# Patient Record
Sex: Male | Born: 1965 | Hispanic: Yes | Marital: Married | Smoking: Former smoker
Health system: Southern US, Community
[De-identification: ages and names within clinical notes are randomized; demographics above are authoritative.]

## PROBLEM LIST (undated history)

## (undated) DIAGNOSIS — T7840XA Allergy, unspecified, initial encounter: Secondary | ICD-10-CM

## (undated) DIAGNOSIS — I10 Essential (primary) hypertension: Secondary | ICD-10-CM

## (undated) HISTORY — DX: Essential (primary) hypertension: I10

## (undated) HISTORY — DX: Allergy, unspecified, initial encounter: T78.40XA

---

## 1997-09-29 ENCOUNTER — Other Ambulatory Visit: Admission: RE | Admit: 1997-09-29 | Discharge: 1997-09-29 | Payer: Self-pay | Admitting: Gynecology

## 1997-10-12 ENCOUNTER — Other Ambulatory Visit: Admission: RE | Admit: 1997-10-12 | Discharge: 1997-10-12 | Payer: Self-pay | Admitting: Gynecology

## 1997-10-26 ENCOUNTER — Other Ambulatory Visit: Admission: RE | Admit: 1997-10-26 | Discharge: 1997-10-26 | Payer: Self-pay | Admitting: Gynecology

## 1997-12-03 ENCOUNTER — Ambulatory Visit (HOSPITAL_COMMUNITY): Admission: RE | Admit: 1997-12-03 | Discharge: 1997-12-03 | Payer: Self-pay | Admitting: Gynecology

## 1998-10-29 ENCOUNTER — Inpatient Hospital Stay (HOSPITAL_COMMUNITY): Admission: AD | Admit: 1998-10-29 | Discharge: 1998-10-29 | Payer: Self-pay | Admitting: Gynecology

## 1998-12-15 ENCOUNTER — Inpatient Hospital Stay (HOSPITAL_COMMUNITY): Admission: AD | Admit: 1998-12-15 | Discharge: 1998-12-15 | Payer: Self-pay | Admitting: Obstetrics and Gynecology

## 1998-12-15 ENCOUNTER — Other Ambulatory Visit: Admission: RE | Admit: 1998-12-15 | Discharge: 1998-12-15 | Payer: Self-pay | Admitting: Gynecology

## 1999-01-12 ENCOUNTER — Inpatient Hospital Stay (HOSPITAL_COMMUNITY): Admission: AD | Admit: 1999-01-12 | Discharge: 1999-01-12 | Payer: Self-pay | Admitting: Obstetrics and Gynecology

## 1999-02-27 ENCOUNTER — Inpatient Hospital Stay (HOSPITAL_COMMUNITY): Admission: AD | Admit: 1999-02-27 | Discharge: 1999-02-27 | Payer: Self-pay | Admitting: Obstetrics

## 1999-05-27 ENCOUNTER — Encounter (HOSPITAL_COMMUNITY): Admission: RE | Admit: 1999-05-27 | Discharge: 1999-06-20 | Payer: Self-pay | Admitting: Obstetrics

## 1999-06-03 ENCOUNTER — Inpatient Hospital Stay (HOSPITAL_COMMUNITY): Admission: AD | Admit: 1999-06-03 | Discharge: 1999-06-03 | Payer: Self-pay | Admitting: Obstetrics & Gynecology

## 1999-06-05 ENCOUNTER — Inpatient Hospital Stay (HOSPITAL_COMMUNITY): Admission: AD | Admit: 1999-06-05 | Discharge: 1999-06-05 | Payer: Self-pay | Admitting: *Deleted

## 1999-06-17 ENCOUNTER — Inpatient Hospital Stay (HOSPITAL_COMMUNITY): Admission: AD | Admit: 1999-06-17 | Discharge: 1999-06-20 | Payer: Self-pay | Admitting: Obstetrics

## 1999-06-23 ENCOUNTER — Encounter: Admission: RE | Admit: 1999-06-23 | Discharge: 1999-07-08 | Payer: Self-pay | Admitting: Obstetrics

## 2000-08-29 ENCOUNTER — Emergency Department (HOSPITAL_COMMUNITY): Admission: EM | Admit: 2000-08-29 | Discharge: 2000-08-29 | Payer: Self-pay | Admitting: Internal Medicine

## 2000-11-02 ENCOUNTER — Emergency Department (HOSPITAL_COMMUNITY): Admission: EM | Admit: 2000-11-02 | Discharge: 2000-11-02 | Payer: Self-pay | Admitting: Emergency Medicine

## 2000-11-11 ENCOUNTER — Emergency Department (HOSPITAL_COMMUNITY): Admission: EM | Admit: 2000-11-11 | Discharge: 2000-11-11 | Payer: Self-pay | Admitting: Emergency Medicine

## 2004-07-03 ENCOUNTER — Emergency Department (HOSPITAL_COMMUNITY): Admission: EM | Admit: 2004-07-03 | Discharge: 2004-07-04 | Payer: Self-pay | Admitting: Emergency Medicine

## 2005-08-22 ENCOUNTER — Emergency Department (HOSPITAL_COMMUNITY): Admission: EM | Admit: 2005-08-22 | Discharge: 2005-08-23 | Payer: Self-pay | Admitting: Emergency Medicine

## 2006-04-25 ENCOUNTER — Emergency Department (HOSPITAL_COMMUNITY): Admission: EM | Admit: 2006-04-25 | Discharge: 2006-04-26 | Payer: Self-pay | Admitting: Emergency Medicine

## 2006-08-22 ENCOUNTER — Emergency Department (HOSPITAL_COMMUNITY): Admission: EM | Admit: 2006-08-22 | Discharge: 2006-08-22 | Payer: Self-pay | Admitting: Emergency Medicine

## 2006-10-02 ENCOUNTER — Emergency Department (HOSPITAL_COMMUNITY): Admission: EM | Admit: 2006-10-02 | Discharge: 2006-10-02 | Payer: Self-pay | Admitting: *Deleted

## 2007-04-11 ENCOUNTER — Emergency Department (HOSPITAL_COMMUNITY): Admission: EM | Admit: 2007-04-11 | Discharge: 2007-04-11 | Payer: Self-pay | Admitting: Emergency Medicine

## 2007-06-20 ENCOUNTER — Emergency Department (HOSPITAL_COMMUNITY): Admission: EM | Admit: 2007-06-20 | Discharge: 2007-06-20 | Payer: Self-pay | Admitting: Emergency Medicine

## 2015-11-22 ENCOUNTER — Ambulatory Visit: Payer: Self-pay

## 2015-11-22 ENCOUNTER — Ambulatory Visit (INDEPENDENT_AMBULATORY_CARE_PROVIDER_SITE_OTHER): Payer: Self-pay | Admitting: Physician Assistant

## 2015-11-22 VITALS — BP 120/86 | HR 73 | Temp 98.6°F | Resp 16 | Ht 66.75 in | Wt 212.0 lb

## 2015-11-22 DIAGNOSIS — T465X1A Poisoning by other antihypertensive drugs, accidental (unintentional), initial encounter: Secondary | ICD-10-CM

## 2015-11-22 DIAGNOSIS — R42 Dizziness and giddiness: Secondary | ICD-10-CM

## 2015-11-22 DIAGNOSIS — I10 Essential (primary) hypertension: Secondary | ICD-10-CM

## 2015-11-22 LAB — COMPLETE METABOLIC PANEL WITH GFR
ALT: 25 U/L (ref 9–46)
AST: 19 U/L (ref 10–40)
Albumin: 4.3 g/dL (ref 3.6–5.1)
Alkaline Phosphatase: 67 U/L (ref 40–115)
BILIRUBIN TOTAL: 0.4 mg/dL (ref 0.2–1.2)
BUN: 14 mg/dL (ref 7–25)
CHLORIDE: 101 mmol/L (ref 98–110)
CO2: 24 mmol/L (ref 20–31)
CREATININE: 0.71 mg/dL (ref 0.60–1.35)
Calcium: 10 mg/dL (ref 8.6–10.3)
GFR, Est African American: >89 mL/min (ref >=60)
GFR, Est Non African American: >89 mL/min (ref >=60)
Glucose, Bld: 76 mg/dL (ref 65–99)
Potassium: 4.1 mmol/L (ref 3.5–5.3)
Sodium: 138 mmol/L (ref 135–146)
TOTAL PROTEIN: 6.6 g/dL (ref 6.1–8.1)

## 2015-11-22 LAB — POCT CBC
Granulocyte percent: 70.2 %G (ref 37–80)
HCT, POC: 44.1 % (ref 43.5–53.7)
HEMOGLOBIN: 16 g/dL (ref 14.1–18.1)
LYMPH, POC: 1.7 (ref 0.6–3.4)
MCH, POC: 31.1 pg (ref 27–31.2)
MCHC: 36.2 g/dL — AB (ref 31.8–35.4)
MCV: 85.7 fL (ref 80–97)
MID (cbc): 0.8 (ref 0–0.9)
MPV: 7.5 fL (ref 0–99.8)
PLATELET COUNT, POC: 310 10*3/uL (ref 142–424)
POC Granulocyte: 5.8 (ref 2–6.9)
POC LYMPH PERCENT: 20.5 %L (ref 10–50)
POC MID %: 9.3 %M (ref 0–12)
RBC: 5.14 M/uL (ref 4.69–6.13)
RDW, POC: 13.9 %
WBC: 8.2 10*3/uL (ref 4.6–10.2)

## 2015-11-22 MED ORDER — LISINOPRIL-HYDROCHLOROTHIAZIDE 10-12.5 MG PO TABS
1.0000 | ORAL_TABLET | Freq: Every day | ORAL | 0 refills | Status: DC
Start: 2015-11-22 — End: 2016-02-28

## 2015-11-22 NOTE — Progress Notes (Signed)
   Subjective:    Patient ID: Jacob ClossSergio Reys, male    DOB: Dec 18, 1965, 50 y.o.   MRN: [id number]  HPI    Review of Systems  Constitutional: Positive for unexpected weight change.  HENT: Negative.   Eyes: Positive for visual disturbance.  Respiratory: Negative.   Cardiovascular: Negative.   Gastrointestinal: Positive for abdominal pain.  Endocrine: Positive for heat intolerance.  Genitourinary: Negative.   Musculoskeletal: Negative.   Skin: Negative.   Allergic/Immunologic: Positive for environmental allergies.  Neurological: Positive for dizziness.  Hematological: Negative.   Psychiatric/Behavioral: Negative.        Objective:   Physical Exam        Assessment & Plan:

## 2015-11-22 NOTE — Patient Instructions (Addendum)
I will follow up with you regarding your lab results.  The symptoms are most likely due to the overuse of the anti-hypertensives.  We will discuss monitoring and medication refills at that time. Make sure you take your blood pressure same time every day to keep track.  If you are exhibiting any dizziness, shortness of breath, nausea, sweating--check your blood pressure with a blood pressure monitoring device.  Do not take medication.      IF you received an x-ray today, you will receive an invoice from [facility]. Please contact [facility] at [phone] with questions or concerns regarding your invoice.   IF you received labwork today, you will receive an invoice from [facility]. Please contact [facility] at [phone] with questions or concerns regarding your invoice.   Our billing staff will not be able to assist you with questions regarding bills from these companies.  You will be contacted with the lab results as soon as they are available. The fastest way to get your results is to activate your My Chart account. Instructions are located on the last page of this paperwork. If you have not heard from us regarding the results in 2 weeks, please contact this office.

## 2015-11-22 NOTE — Progress Notes (Addendum)
Patient ID: Jacob Kelley, male   DOB: [birth year], 50 y.o.   MRN: [id number] [facility] [address], [place] [address] [phone]  By signing my name below I, Shelah Lewandowsky, attest that this documentation has been prepared under the direction and in the presence of Trena Platt PA. Electonically Signed. Shelah Lewandowsky, Scribe 11/22/2015 at 3:29 PM   Date:  11/22/2015   Name:  Jacob Kelley   DOB:  1965-10-18   MRN:  [id number]  PCP:  No primary care provider on file.    History of Present Illness:  Jacob Kelley is a 50 y.o. male patient who presents to [facility] c/o dizziness that occurred yesterday and improved today. Pt was drinking a heavily sweetened sweet tea while at work yesterday, then went to a friends house that was very hot because his friend did not have air conditioning. Pt did not drink any water at his friends house. Pt states he then started feeling dizzy every time he stood up. Pt thought that he forgot to take his Lisinopril-hydrochlorothiazide in the morning so he took a second dose. Pt has HTN and is supposed to take Lisinopril hydrochlorothiazide QD in the morning. Pt states that his dizziness worsened after he took his second dose accidentally. Pt also started having diaphoresis, nausea, and hand tremors. Pt denies any CP, palpitations, SOB, blurry vision, or diarrhea.  Pt states he feels back to normal currently.   Pt reports he usually drink 1-2 beers 3-4 times a week.   Pt denies tobacco or any illegal drug use.    Pt reports having frequent burping within the past several months. Pt states he eats a lot of spicy food. Pt has history of GERD and takes TUMs and Alka-Seltzer which typically resolves his symptoms.  Pt states that he ate breakfast this morning.  There are no active problems to display for this patient.   Past Medical History:  Diagnosis Date   Allergy    Hypertension     No past surgical history on file.  Social  History  Substance Use Topics   Smoking status: Former Smoker   Smokeless tobacco: Former Neurosurgeon   Alcohol use 4.2 oz/week    7 Glasses of wine per week    Family History  Problem Relation Age of Onset   Stroke Mother    Heart disease Father    Hypertension Brother     Allergies  Allergen Reactions   Ibuprofen     Swelling in face, eyes    Medication list has been reviewed and updated.  No current outpatient prescriptions on file prior to visit.   No current facility-administered medications on file prior to visit.     ROS ROS unremarkable unless otherwise specified.  Physical Examination: BP 120/86 (BP Location: Left Arm, Patient Position: Sitting, Cuff Size: Normal)    Pulse 73    Temp 98.6 F (37 C) (Oral)    Resp 16    Ht 5' 6.75" (1.695 m)    Wt 212 lb (96.2 kg)    SpO2 96%    BMI 33.45 kg/m  Ideal Body Weight: ([id number])@  Physical Exam  Constitutional: He is oriented to person, place, and time. He appears well-developed and well-nourished. No distress.  HENT:  Head: Normocephalic and atraumatic.  Eyes: Conjunctivae and EOM are normal. Pupils are equal, round, and reactive to light.  Cardiovascular: Normal rate and regular rhythm.  Exam reveals no gallop and no friction rub.  No murmur heard. Pulses:      Carotid pulses are 2+ on the right side, and 2+ on the left side.      Radial pulses are 2+ on the right side, and 2+ on the left side.       Dorsalis pedis pulses are 2+ on the right side, and 2+ on the left side.  Pulmonary/Chest: Effort normal. No respiratory distress. He has no decreased breath sounds. He has no wheezes. He has no rhonchi.  Neurological: He is alert and oriented to person, place, and time.  Skin: Skin is warm and dry. He is not diaphoretic.  Psychiatric: He has a normal mood and affect. His behavior is normal.   Results for orders placed or performed in visit on 11/22/15  POCT CBC  Result Value Ref Range   WBC 8.2 4.6  - 10.2 K/uL   Lymph, poc 1.7 0.6 - 3.4   POC LYMPH PERCENT 20.5 10 - 50 %L   MID (cbc) 0.8 0 - 0.9   POC MID % 9.3 0 - 12 %M   POC Granulocyte 5.8 2 - 6.9   Granulocyte percent 70.2 37 - 80 %G   RBC 5.14 4.69 - 6.13 M/uL   Hemoglobin 16.0 14.1 - 18.1 g/dL   HCT, POC 86.544.1 78.443.5 - 53.7 %   MCV 85.7 80 - 97 fL   MCH, POC 31.1 27 - 31.2 pg   MCHC 36.2 (A) 31.8 - 35.4 g/dL   RDW, POC 69.613.9 %   Platelet Count, POC 310 142 - 424 K/uL   MPV 7.5 0 - 99.8 fL   EKG viewed and interpreted by CanadaStephanie English. EKG shows sinus rhythm, T wave inversion lead III, Possible old anterior infarct, No acute changes.   Assessment and Plan: Hughie ClossSergio Reys is a 50 y.o. male who is here today for dizziness.   This appears secondary to over medicating of anti-hypertensive by accident.  Counseled on this. I am refilling his medication at a neighboring pharmacy of his interest.  He would like to establish care at our facility. So labs are obtained. We can refill for 6 months.  Advised to return if dizziness returns.    Dizziness - Plan: COMPLETE METABOLIC PANEL WITH GFR, EKG 12-Lead, POCT CBC, Orthostatic vital signs, Lipid panel  Essential hypertension - Plan: COMPLETE METABOLIC PANEL WITH GFR, EKG 12-Lead, POCT CBC, Orthostatic vital signs, Lipid panel, lisinopril-hydrochlorothiazide (PRINZIDE,ZESTORETIC) 10-12.5 MG tablet  Overdose of antihypertensive agent, accidental or unintentional, initial encounter - Plan: COMPLETE METABOLIC PANEL WITH GFR, EKG 12-Lead, POCT CBC, Orthostatic vital signs  Trena PlattStephanie English, PA-C [facility] 11/22/2015 2:23 PM  I personally performed the services described in this documentation, which was scribed in my presence. The recorded information has been reviewed and is accurate.

## 2015-12-11 ENCOUNTER — Emergency Department (HOSPITAL_COMMUNITY): Payer: Self-pay

## 2015-12-11 ENCOUNTER — Encounter (HOSPITAL_COMMUNITY): Payer: Self-pay | Admitting: Emergency Medicine

## 2015-12-11 ENCOUNTER — Emergency Department (HOSPITAL_COMMUNITY)
Admission: EM | Admit: 2015-12-11 | Discharge: 2015-12-11 | Disposition: A | Discharge status: Home / Self Care | Payer: Self-pay | Attending: Emergency Medicine | Admitting: Emergency Medicine

## 2015-12-11 DIAGNOSIS — I10 Essential (primary) hypertension: Secondary | ICD-10-CM | POA: Insufficient documentation

## 2015-12-11 DIAGNOSIS — Z87891 Personal history of nicotine dependence: Secondary | ICD-10-CM | POA: Insufficient documentation

## 2015-12-11 DIAGNOSIS — Z79899 Other long term (current) drug therapy: Secondary | ICD-10-CM | POA: Insufficient documentation

## 2015-12-11 DIAGNOSIS — R0789 Other chest pain: Secondary | ICD-10-CM | POA: Insufficient documentation

## 2015-12-11 DIAGNOSIS — R1013 Epigastric pain: Secondary | ICD-10-CM | POA: Insufficient documentation

## 2015-12-11 LAB — COMPREHENSIVE METABOLIC PANEL
ALT: 25 U/L (ref 17–63)
AST: 21 U/L (ref 15–41)
Albumin: 4.1 g/dL (ref 3.5–5.0)
Alkaline Phosphatase: 67 U/L (ref 38–126)
Anion gap: 6 (ref 5–15)
BUN: 16 mg/dL (ref 6–20)
CO2: 28 mmol/L (ref 22–32)
Calcium: 9.4 mg/dL (ref 8.9–10.3)
Chloride: 103 mmol/L (ref 101–111)
Creatinine, Ser: 0.86 mg/dL (ref 0.61–1.24)
GFR calc Af Amer: >60 mL/min (ref >60)
GFR calc non Af Amer: >60 mL/min (ref >60)
Glucose, Bld: 107 mg/dL — ABNORMAL HIGH (ref 65–99)
Potassium: 3.8 mmol/L (ref 3.5–5.1)
Sodium: 137 mmol/L (ref 135–145)
Total Bilirubin: 0.6 mg/dL (ref 0.3–1.2)
Total Protein: 7.1 g/dL (ref 6.5–8.1)

## 2015-12-11 LAB — I-STAT TROPONIN, ED
TROPONIN I, POC: 0 ng/mL (ref 0.00–0.08)
Troponin i, poc: 0 ng/mL (ref 0.00–0.08)

## 2015-12-11 LAB — CBC
HCT: 43.5 % (ref 39.0–52.0)
Hemoglobin: 14.9 g/dL (ref 13.0–17.0)
MCH: 29.7 pg (ref 26.0–34.0)
MCHC: 34.3 g/dL (ref 30.0–36.0)
MCV: 86.8 fL (ref 78.0–100.0)
Platelets: 274 10*3/uL (ref 150–400)
RBC: 5.01 MIL/uL (ref 4.22–5.81)
RDW: 13.2 % (ref 11.5–15.5)
WBC: 8.1 10*3/uL (ref 4.0–10.5)

## 2015-12-11 LAB — LIPASE, BLOOD: Lipase: 28 U/L (ref 11–51)

## 2015-12-11 LAB — URINALYSIS, ROUTINE W REFLEX MICROSCOPIC
Bilirubin Urine: NEGATIVE
Glucose, UA: NEGATIVE mg/dL
Hgb urine dipstick: NEGATIVE
Ketones, ur: NEGATIVE mg/dL
Leukocytes, UA: NEGATIVE
Nitrite: NEGATIVE
Protein, ur: NEGATIVE mg/dL
Specific Gravity, Urine: 1.008 (ref 1.005–1.030)
pH: 6 (ref 5.0–8.0)

## 2015-12-11 MED ORDER — ONDANSETRON HCL 4 MG/2ML IJ SOLN
4.0000 mg | Freq: Once | INTRAMUSCULAR | Status: AC
  Administered 2015-12-11: 4 mg via INTRAVENOUS
  Filled 2015-12-11: qty 2

## 2015-12-11 MED ORDER — SODIUM CHLORIDE 0.9 % IV BOLUS (SEPSIS)
1000.0000 mL | Freq: Once | INTRAVENOUS | Status: AC
  Administered 2015-12-11: 1000 mL via INTRAVENOUS

## 2015-12-11 MED ORDER — MORPHINE SULFATE (PF) 4 MG/ML IV SOLN
4.0000 mg | Freq: Once | INTRAVENOUS | Status: AC
  Administered 2015-12-11: 4 mg via INTRAVENOUS
  Filled 2015-12-11: qty 1

## 2015-12-11 MED ORDER — GI COCKTAIL ~~LOC~~
30.0000 mL | Freq: Once | ORAL | Status: AC
Start: 2015-12-11 — End: 2015-12-11
  Administered 2015-12-11: 30 mL via ORAL
  Filled 2015-12-11: qty 30

## 2015-12-11 MED ORDER — PANTOPRAZOLE SODIUM 20 MG PO TBEC: 20.0000 mg | DELAYED_RELEASE_TABLET | Freq: Every day | ORAL | 0 refills | Status: DC

## 2015-12-11 MED ORDER — ONDANSETRON 4 MG PO TBDP: 4.0000 mg | ORAL_TABLET | Freq: Three times a day (TID) | ORAL | 0 refills | Status: DC | PRN

## 2015-12-11 NOTE — ED Notes (Signed)
RN starting IV, drawing labs 

## 2015-12-11 NOTE — ED Triage Notes (Signed)
Pt c/o sudden onset dizziness and chest pressure x 1 hour. Pt c/o 1 episode of nausea. Pt also c/o abdominal pain x 2 days. A&Ox4 and ambulatory. NAD noted, pt speaking in complete sentences.

## 2015-12-11 NOTE — ED Notes (Signed)
Pt O2 was between 98-100% and pulse was 82

## 2015-12-11 NOTE — ED Provider Notes (Signed)
[facility]-EMERGENCY DEPT Provider Note   CSN: [id number] Arrival date & time: 12/11/15  1648     History   Chief Complaint Chief Complaint  Patient presents with  . Dizziness  . Abdominal Pain  . Chest Pain    HPI Jacob Kelley is a 50 y.o. male.  HPI   Patient is a 50 year old male presents emergency department for evaluation of dizziness, lightheadedness, rapid heart rate, with associated cold sweats, which began at 2:30 while he was driving with his family members. He states that he didn't have any chest pain or shortness of breath during this time it did begin at rest and lasted probably 45 minutes. He drinks some sweet tea and his symptoms resolved.  He has been having similar episodes intermittently over the past 2 weeks which seemed to occur after he has had abdominal pain with associated bloating, excessive belching, and nausea. He did not eat yesterday because of the same symptoms and this morning was working was able to eat breakfast without any pain or discomfort.  He does work in a [employer] and was working all morning prior to the onset of his symptoms at 2:30.  He was seen by his regular doctor in the last week and had lab work obtained.  He denies cough, shortness of breath, wheeze, lower extremity edema, palpitations, orthopnea, PND. He has had no vomiting, states he is having normal bowel movements.  He is a former smoker. He denies any history of exertional near-syncope, exertional dyspnea or exertional chest pain. No history of diabetes, no family history of cardiac disease.   Past Medical History:  Diagnosis Date  . Allergy   . Hypertension     There are no active problems to display for this patient.   History reviewed. No pertinent surgical history.     Home Medications    Prior to Admission medications   Medication Sig Start Date End Date Taking? Authorizing Provider  lisinopril-hydrochlorothiazide (PRINZIDE,ZESTORETIC) 10-12.5 MG tablet Take 1 tablet by  mouth daily. 11/22/15  Yes Stephanie D English, PA  ondansetron (ZOFRAN ODT) 4 MG disintegrating tablet Take 1 tablet (4 mg total) by mouth every 8 (eight) hours as needed for nausea or vomiting. 12/11/15   Danelle Berry, PA-C  pantoprazole (PROTONIX) 20 MG tablet Take 1 tablet (20 mg total) by mouth daily. 12/11/15   Danelle Berry, PA-C    Family History Family History  Problem Relation Age of Onset  . Stroke Mother   . Heart disease Father   . Hypertension Brother     Social History Social History  Substance Use Topics  . Smoking status: Former Games developer  . Smokeless tobacco: Former Neurosurgeon  . Alcohol use 4.2 oz/week    7 Glasses of wine per week     Allergies   Ibuprofen   Review of Systems Review of Systems  All other systems reviewed and are negative.    Physical Exam Updated Vital Signs BP 128/95   Pulse 60   Temp 98.1 F (36.7 C) (Oral)   Resp 21   Ht 5' 6.75" (1.695 m)   Wt 96.2 kg   SpO2 100%   BMI 33.45 kg/m   Physical Exam  Constitutional: He is oriented to person, place, and time. He appears well-developed and well-nourished. No distress.  HENT:  Head: Normocephalic and atraumatic.  Nose: Nose normal.  Mouth/Throat: Oropharynx is clear and moist. No oropharyngeal exudate.  Eyes: Conjunctivae and EOM are normal. Pupils are equal, round, and reactive to  light. Right eye exhibits no discharge. Left eye exhibits no discharge. No scleral icterus.  Neck: Normal range of motion. No JVD present.  Cardiovascular: Normal rate, regular rhythm, normal heart sounds and intact distal pulses.  Exam reveals no gallop and no friction rub.   No murmur heard. No LE edema, symmetrical radial pulses 2+, symmetrical dorsal pedis pulse 2+  Pulmonary/Chest: Effort normal and breath sounds normal. No stridor. No respiratory distress. He has no wheezes. He has no rales. He exhibits no tenderness.  Abdominal: Soft. Bowel sounds are normal. He exhibits no distension and no mass. There is  no tenderness. There is no rebound and no guarding.  Musculoskeletal: Normal range of motion.  Lymphadenopathy:    He has no cervical adenopathy.  Neurological: He is alert and oriented to person, place, and time. He exhibits normal muscle tone. Coordination normal.  Skin: Skin is warm and dry. Capillary refill takes less than 2 seconds. No rash noted. He is not diaphoretic. No erythema. No pallor.  Psychiatric: He has a normal mood and affect. His behavior is normal. Judgment and thought content normal.  Nursing note and vitals reviewed.    ED Treatments / Results  Labs (all labs ordered are listed, but only abnormal results are displayed) Labs Reviewed  COMPREHENSIVE METABOLIC PANEL - Abnormal; Notable for the following:       Result Value   Glucose, Bld 107 (*)    All other components within normal limits  CBC  URINALYSIS, ROUTINE W REFLEX MICROSCOPIC (NOT AT [facility])  LIPASE, BLOOD  I-STAT TROPOININ, ED  Rosezena SensorI-STAT TROPOININ, ED    EKG  EKG Interpretation None       Radiology Dg Chest 2 View  Result Date: 12/11/2015 CLINICAL DATA:  Chest pain. EXAM: CHEST  2 VIEW COMPARISON:  None. FINDINGS: The heart size and mediastinal contours are within normal limits. Both lungs are clear. The visualized skeletal structures are unremarkable. IMPRESSION: Normal chest. Electronically Signed   By: Francene BoyersJames  Maxwell M.D.   On: 12/11/2015 17:39    Procedures Procedures (including critical care time)  Medications Ordered in ED Medications  sodium chloride 0.9 % bolus 1,000 mL (0 mLs Intravenous Stopped 12/11/15 2021)  ondansetron (ZOFRAN) injection 4 mg (4 mg Intravenous Given 12/11/15 1934)  morphine 4 MG/ML injection 4 mg (4 mg Intravenous Given 12/11/15 1934)  gi cocktail (Maalox,Lidocaine,Donnatal) (30 mLs Oral Given 12/11/15 1934)     Initial Impression / Assessment and Plan / ED Course  I have reviewed the triage vital signs and the nursing notes.  Pertinent labs & imaging results  that were available during my care of the patient were reviewed by me and considered in my medical decision making (see chart for details).  Clinical Course   Pt with abdominal pain, CP and lightheaded/dizzy episodes which have been intermittent over the past 2 weeks. Last episode began at 2:30 this afternoon while at rest, lasted about 5 minutes and was relieved after drinking sweet tea.  In the ER patient is asymptomatic.  CP work up initiated, GI cocktail given.  Patient is to be discharged with recommendation to follow up with PCP in regards to today's hospital visit. Chest pain is not likely of cardiac or pulmonary etiology d/t presentation, perc negative, VSS, no tracheal deviation, no JVD or new murmur, RRR, breath sounds equal bilaterally, EKG without acute abnormalities, negative troponin, and negative CXR. Pt has been advised start a PPI and return to the ED is CP becomes exertional, associated with  diaphoresis or nausea, radiates to left jaw/arm, worsens or becomes concerning in any way. Pt appears reliable for follow up and is agreeable to discharge.    Final Clinical Impressions(s) / ED Diagnoses   Final diagnoses:  Atypical chest pain  Dyspepsia    New Prescriptions Discharge Medication List as of 12/11/2015  8:33 PM    START taking these medications   Details  ondansetron (ZOFRAN ODT) 4 MG disintegrating tablet Take 1 tablet (4 mg total) by mouth every 8 (eight) hours as needed for nausea or vomiting., Starting Sat 12/11/2015, Print    pantoprazole (PROTONIX) 20 MG tablet Take 1 tablet (20 mg total) by mouth daily., Starting Sat 12/11/2015, Print         Danelle Berry, PA-C 12/11/15 2205    Lorre Nick, MD 12/12/15 [phone]

## 2015-12-11 NOTE — ED Notes (Signed)
ED PA at bedside

## 2015-12-11 NOTE — ED Notes (Signed)
Sprite was given for po challenge---- tolerating well; pt denies nausea.

## 2015-12-14 ENCOUNTER — Ambulatory Visit (INDEPENDENT_AMBULATORY_CARE_PROVIDER_SITE_OTHER): Payer: Self-pay | Admitting: Physician Assistant

## 2015-12-14 DIAGNOSIS — I10 Essential (primary) hypertension: Secondary | ICD-10-CM

## 2015-12-14 DIAGNOSIS — R42 Dizziness and giddiness: Secondary | ICD-10-CM

## 2015-12-14 LAB — LIPID PANEL
CHOL/HDL RATIO: 4.7 ratio (ref <=5.0)
Cholesterol: 214 mg/dL — ABNORMAL HIGH (ref 125–200)
HDL: 46 mg/dL (ref >=40)
LDL Cholesterol: 149 mg/dL — ABNORMAL HIGH (ref <130)
Triglycerides: 95 mg/dL (ref <150)
VLDL: 19 mg/dL (ref <30)

## 2016-01-04 NOTE — Progress Notes (Signed)
Lab only 

## 2016-02-28 ENCOUNTER — Other Ambulatory Visit: Payer: Self-pay

## 2016-02-28 DIAGNOSIS — I10 Essential (primary) hypertension: Secondary | ICD-10-CM

## 2016-02-28 MED ORDER — LISINOPRIL-HYDROCHLOROTHIAZIDE 10-12.5 MG PO TABS: 1.0000 | ORAL_TABLET | Freq: Every day | ORAL | 0 refills | Status: DC

## 2016-02-28 NOTE — Telephone Encounter (Signed)
Pharm called w/pt there asking for RF of lisinopril. I do not see any completed notes from his OV so will follow protocol. Gave OK for another 90 day supply and pharm agreed to tell pt he needs to RTC for f/up before those run out.

## 2016-06-08 ENCOUNTER — Ambulatory Visit (INDEPENDENT_AMBULATORY_CARE_PROVIDER_SITE_OTHER): Payer: Self-pay | Admitting: Family Medicine

## 2016-06-08 ENCOUNTER — Encounter: Payer: Self-pay | Admitting: Family Medicine

## 2016-06-08 VITALS — BP 128/82 | HR 78 | Temp 98.2°F | Resp 18 | Ht 66.75 in | Wt 213.0 lb

## 2016-06-08 DIAGNOSIS — R05 Cough: Secondary | ICD-10-CM

## 2016-06-08 DIAGNOSIS — I10 Essential (primary) hypertension: Secondary | ICD-10-CM

## 2016-06-08 DIAGNOSIS — L209 Atopic dermatitis, unspecified: Secondary | ICD-10-CM

## 2016-06-08 DIAGNOSIS — R059 Cough, unspecified: Secondary | ICD-10-CM

## 2016-06-08 DIAGNOSIS — E785 Hyperlipidemia, unspecified: Secondary | ICD-10-CM

## 2016-06-08 MED ORDER — LOSARTAN POTASSIUM-HCTZ 50-12.5 MG PO TABS: 1.0000 | ORAL_TABLET | Freq: Every day | ORAL | 1 refills | Status: DC

## 2016-06-08 NOTE — Patient Instructions (Addendum)
Your cough may be coming from lisinopril. Change to losartan HCTZ 1 pill once per day. If blood pressures start running over 140/90, or any new side effects of that medication, return to discuss other options.  Return within the next few weeks for fasting labs. We can check your cholesterol again at that time and determine if other medicines are needed.  The rash appears to be due to eczema or atopic dermatitis. Use over-the-counter Eucerin, cortisone cream up to twice per day, and if that does not help, return to discuss other options.   Atopic Dermatitis Atopic dermatitis is a skin disorder that causes inflammation of the skin. This is the most common type of eczema. Eczema is a group of skin conditions that cause the skin to be itchy, red, and swollen. This condition is generally worse during the cooler winter months and often improves during the warm summer months. Symptoms can vary from person to person. Atopic dermatitis usually starts showing signs in infancy and can last through adulthood. This condition cannot be passed from one person to another (non-contagious), but is more common in families. Atopic dermatitis may not always be present. When it is present, it is called a flare-up. What are the causes? The exact cause of this condition is not known. Flare-ups of the condition may be triggered by:  Contact with something you are sensitive or allergic to.  Stress.  Certain foods.  Extremely hot or cold weather.  Harsh chemicals and soaps.  Dry air.  Chlorine. What increases the risk? This condition is more likely to develop in people who have a personal history or family history of eczema, allergies, asthma, or hay fever. What are the signs or symptoms? Symptoms of this condition include:  Dry, scaly skin.  Red, itchy rash.  Itchiness, which can be severe. This may occur before the skin rash. This can make sleeping difficult.  Skin thickening and cracking can occur over  time. How is this diagnosed? This condition is diagnosed based on your symptoms, a medical history, and a physical exam. How is this treated? There is no cure for this condition, but symptoms can usually be controlled. Treatment focuses on:  Controlling the itching and scratching. You may be given medicines, such as antihistamines or steroid creams.  Limiting exposure to things that you are sensitive or allergic to (allergens).  Recognizing situations that cause stress and developing a plan to manage stress. If your atopic dermatitis does not get better with medicines or is all over your body (widespread) , a treatment using a specific type of light (phototherapy) may be used. Follow these instructions at home: Skin care  Keep your skin well-moisturized. This seals in moisture and help prevent dryness.  Use unscented lotions that have petroleum in them.  Avoid lotions that contain alcohol and water. They can dry the skin.  Keep baths or showers short (less than 5 minutes) in warm water. Do not use hot water.  Use mild, unscented cleansers for bathing. Avoid soap and bubble bath.  Apply a moisturizer to your skin right after a bath or shower.   Do not apply anything to your skin without checking with your health care provider. General instructions  Dress in clothes made of cotton or cotton blends. Dress lightly because heat increases itching.  When washing your clothes, rinse your clothes twice so all of the soap is removed.  Avoid any triggers that can cause a flare-up.  Try to manage your stress.  Keep your fingernails  cut short.  Avoid scratching. Scratching makes the rash and itching worse. It may also result in a skin infection (impetigo) due to a break in the skin caused by scratching.  Take or apply over-the-counter and prescription medicines only as told by your health care provider.  Keep all follow-up visits as told by your health care provider. This is  important.  Do not be around people who have cold sores or fever blisters. If you get the infection, it may cause your atopic dermatitis to worsen. Contact a health care provider if:  Your itching interferes with sleep.  Your rash gets worse or is not better within one week of starting treatment.  You have a fever.  You have a rash flare-up after having contact with someone who has cold sores or fever blisters. Get help right away if:  You develop pus or soft yellow scabs in the rash area. Summary  This condition causes a red rash and itchy, dry, scaly skin.  Treatment focuses on controlling the itching and scratching, limiting exposure to things that you are sensitive or allergic to (allergens), and recognizing situations that cause stress and developing a plan to manage stress.  Keep your skin well-moisturized.  Keep baths or showers less than 5 minutes. This information is not intended to replace advice given to you by your health care provider. Make sure you discuss any questions you have with your health care provider. Document Released: 03/31/2000 Document Revised: 09/09/2015 Document Reviewed: 11/04/2012 Elsevier Interactive Patient Education  2017 [employer].     IF you received an x-ray today, you will receive an invoice from [facility]. Please contact [facility] at [phone] with questions or concerns regarding your invoice.   IF you received labwork today, you will receive an invoice from [facility]. Please contact [facility] at [phone] with questions or concerns regarding your invoice.   Our billing staff will not be able to assist you with questions regarding bills from these companies.  You will be contacted with the lab results as soon as they are available. The fastest way to get your results is to activate your My Chart account. Instructions are located on the last page of this paperwork. If you have not heard from [place] regarding the  results in 2 weeks, please contact this office.

## 2016-06-08 NOTE — Progress Notes (Addendum)
By signing my name below, I, Mesha Guinyard, attest that this documentation has been prepared under the direction and in the presence of Merri Ray, MD.  Electronically Signed: Verlee Monte, Medical Scribe. 06/08/16. 11:38 AM.  Subjective:    Patient ID: Jacob Kelley, male    DOB: May 12, 1965, 51 y.o.   MRN: [id number]  HPI Chief Complaint  Patient presents with  . Medication Refill    lisinopril    HPI Comments: Jacob Kelley is a 51 y.o. male who presents to the [facility] complaining for HTN follow-up.  Pt is not fasting.  HTN: He is on lisinopril-HCTZ 10/12.5 mg QD. Last visit in Aug 2017 for labs only. Pt takes lisinopril-HCTZ in the morning but he occasionally misses his medication. Reports occasional dizziness that resolves with drinking water, and he has a slight cough that occurs in the evening every 4-5 days. Pt reports the time he took 2 pills at once to make up for a missed dose the day before and he went to the hospital after feeling badly. He no longer takes more than the recommended amount. Denies chest pain, SOB, light-headedness, or other acute sxs. Lab Results  Component Value Date   CREATININE 0.86 12/11/2015   BP Readings from Last 3 Encounters:  06/08/16 128/82  12/11/15 128/95  11/22/15 120/86   HLD: Recommended to start OTC Omega-3 supplement and recheck levels in approx 6 months. Pt has not been taking the fish-oil supplements. Pt mentions he eats a lot of grilled shrimp and fish - he mainly eats it while at work. 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 Pt stays away from cheese. Lab Results  Component Value Date   CHOL 214 (H) 12/14/2015   HDL 46 12/14/2015   LDLCALC 149 (H) 12/14/2015   TRIG 95 12/14/2015   CHOLHDL 4.7 12/14/2015   Dry Skin: Located in various places on his body such as his ankles, leg, and elbows. It only occurs during the winter and he uses cortisone cream Intermittently for relief of his  sxs.  There are no active problems to display for this patient.  Past Medical History:  Diagnosis Date  . Allergy   . Hypertension    History reviewed. No pertinent surgical history. Allergies  Allergen Reactions  . Ibuprofen Swelling and Other (See Comments)    Reaction:  Facial/eye swelling    Prior to Admission medications   Medication Sig Start Date End Date Taking? Authorizing Provider  lisinopril-hydrochlorothiazide (PRINZIDE,ZESTORETIC) 10-12.5 MG tablet Take 1 tablet by mouth daily. 02/28/16  Yes Stephanie D English, PA  ondansetron (ZOFRAN ODT) 4 MG disintegrating tablet Take 1 tablet (4 mg total) by mouth every 8 (eight) hours as needed for nausea or vomiting. 12/11/15   Delsa Grana, PA-C  pantoprazole (PROTONIX) 20 MG tablet Take 1 tablet (20 mg total) by mouth daily. 12/11/15   Delsa Grana, PA-C   Social History   Social History  . Marital status: Single    Spouse name: N/A  . Number of children: N/A  . Years of education: N/A   Occupational History  . Not on file.   Social History Main Topics  . Smoking status: Former Research scientist (life sciences)  . Smokeless tobacco: Former Systems developer  . Alcohol use 4.2 oz/week    7 Glasses of wine per week  . Drug use: No  . Sexual activity: Not on file   Other Topics Concern  . Not on file   Social History Narrative  .  No narrative on file   Review of Systems  Constitutional: Negative for fatigue and unexpected weight change.  Eyes: Negative for visual disturbance.  Respiratory: Positive for cough. Negative for chest tightness and shortness of breath.   Cardiovascular: Negative for chest pain, palpitations and leg swelling.  Gastrointestinal: Negative for abdominal pain and blood in stool.  Skin: Positive for color change and rash. Negative for pallor.  Neurological: Positive for dizziness. Negative for light-headedness and headaches.   Objective:  Physical Exam  Constitutional: He is oriented to person, place, and time. He appears  well-developed and well-nourished.  HENT:  Head: Normocephalic and atraumatic.  Eyes: EOM are normal. Pupils are equal, round, and reactive to light.  Neck: No JVD present. Carotid bruit is not present.  Cardiovascular: Normal rate, regular rhythm and normal heart sounds.  Exam reveals no gallop and no friction rub.   No murmur heard. Pulmonary/Chest: Effort normal and breath sounds normal. No respiratory distress. He has no wheezes. He has no rales.  Musculoskeletal: He exhibits no edema.  Neurological: He is alert and oriented to person, place, and time.  Skin: Skin is warm and dry. Rash noted. There is erythema.  Slightly thickened hyperpigmented/red skin medial right lower leg as well as some areas on the left anterior leg  Psychiatric: He has a normal mood and affect.  Vitals reviewed.   Vitals:   06/08/16 1044  BP: 128/82  Pulse: 78  Resp: 18  Temp: 98.2 F (36.8 C)  TempSrc: Oral  SpO2: 96%  Weight: 213 lb (96.6 kg)  Height: 5' 6.75" (1.695 m)  Body mass index is 33.61 kg/m. Assessment & Plan:    Jacob Kelley is a 51 y.o. male Essential hypertension - Plan: losartan-hydrochlorothiazide (HYZAAR) 50-12.5 MG tablet, CMP14+EGFR  - Stable, but with dry cough, potentially ACE inhibitor cough. Trial of losartan 50/12.5 milligrams daily, check labs. If cough persists with losartan, unlikely ACE inhibitor cause.  Cough  - As above, possible ACE inhibitor cause. If persistent, return to discuss other causes. Handout given.  Atopic dermatitis, unspecified type  -Over-the-counter Eucerin twice a day when necessary, okay to continue topical cortisone as mild symptoms, but consider triamcinolone or stronger potency steroid if persistent symptoms.  Hyperlipidemia, unspecified hyperlipidemia type - Plan: CMP14+EGFR, Lipid panel  - Check labs, then determine 10 year risk to decide if statin indicated.  Meds ordered this encounter  Medications  . losartan-hydrochlorothiazide  (HYZAAR) 50-12.5 MG tablet    Sig: Take 1 tablet by mouth daily.    Dispense:  90 tablet    Refill:  1   Patient Instructions   Your cough may be coming from lisinopril. Change to losartan HCTZ 1 pill once per day. If blood pressures start running over 140/90, or any new side effects of that medication, return to discuss other options.  Return within the next few weeks for fasting labs. We can check your cholesterol again at that time and determine if other medicines are needed.  The rash appears to be due to eczema or atopic dermatitis. Use over-the-counter Eucerin, cortisone cream up to twice per day, and if that does not help, return to discuss other options.   Atopic Dermatitis Atopic dermatitis is a skin disorder that causes inflammation of the skin. This is the most common type of eczema. Eczema is a group of skin conditions that cause the skin to be itchy, red, and swollen. This condition is generally worse during the cooler winter months and often improves during  the warm summer months. Symptoms can vary from person to person. Atopic dermatitis usually starts showing signs in infancy and can last through adulthood. This condition cannot be passed from one person to another (non-contagious), but is more common in families. Atopic dermatitis may not always be present. When it is present, it is called a flare-up. What are the causes? The exact cause of this condition is not known. Flare-ups of the condition may be triggered by:  Contact with something you are sensitive or allergic to.  Stress.  Certain foods.  Extremely hot or cold weather.  Harsh chemicals and soaps.  Dry air.  Chlorine. What increases the risk? This condition is more likely to develop in people who have a personal history or family history of eczema, allergies, asthma, or hay fever. What are the signs or symptoms? Symptoms of this condition include:  Dry, scaly skin.  Red, itchy rash.  Itchiness,  which can be severe. This may occur before the skin rash. This can make sleeping difficult.  Skin thickening and cracking can occur over time. How is this diagnosed? This condition is diagnosed based on your symptoms, a medical history, and a physical exam. How is this treated? There is no cure for this condition, but symptoms can usually be controlled. Treatment focuses on:  Controlling the itching and scratching. You may be given medicines, such as antihistamines or steroid creams.  Limiting exposure to things that you are sensitive or allergic to (allergens).  Recognizing situations that cause stress and developing a plan to manage stress. If your atopic dermatitis does not get better with medicines or is all over your body (widespread) , a treatment using a specific type of light (phototherapy) may be used. Follow these instructions at home: Skin care  Keep your skin well-moisturized. This seals in moisture and help prevent dryness.  Use unscented lotions that have petroleum in them.  Avoid lotions that contain alcohol and water. They can dry the skin.  Keep baths or showers short (less than 5 minutes) in warm water. Do not use hot water.  Use mild, unscented cleansers for bathing. Avoid soap and bubble bath.  Apply a moisturizer to your skin right after a bath or shower.   Do not apply anything to your skin without checking with your health care provider. General instructions  Dress in clothes made of cotton or cotton blends. Dress lightly because heat increases itching.  When washing your clothes, rinse your clothes twice so all of the soap is removed.  Avoid any triggers that can cause a flare-up.  Try to manage your stress.  Keep your fingernails cut short.  Avoid scratching. Scratching makes the rash and itching worse. It may also result in a skin infection (impetigo) due to a break in the skin caused by scratching.  Take or apply over-the-counter and prescription  medicines only as told by your health care provider.  Keep all follow-up visits as told by your health care provider. This is important.  Do not be around people who have cold sores or fever blisters. If you get the infection, it may cause your atopic dermatitis to worsen. Contact a health care provider if:  Your itching interferes with sleep.  Your rash gets worse or is not better within one week of starting treatment.  You have a fever.  You have a rash flare-up after having contact with someone who has cold sores or fever blisters. Get help right away if:  You develop pus or soft  yellow scabs in the rash area. Summary  This condition causes a red rash and itchy, dry, scaly skin.  Treatment focuses on controlling the itching and scratching, limiting exposure to things that you are sensitive or allergic to (allergens), and recognizing situations that cause stress and developing a plan to manage stress.  Keep your skin well-moisturized.  Keep baths or showers less than 5 minutes. This information is not intended to replace advice given to you by your health care provider. Make sure you discuss any questions you have with your health care provider. Document Released: 03/31/2000 Document Revised: 09/09/2015 Document Reviewed: 11/04/2012 Elsevier Interactive Patient Education  2017 [employer].     IF you received an x-ray today, you will receive an invoice from [facility]. Please contact [facility] at [phone] with questions or concerns regarding your invoice.   IF you received labwork today, you will receive an invoice from [facility]. Please contact [facility] at [phone] with questions or concerns regarding your invoice.   Our billing staff will not be able to assist you with questions regarding bills from these companies.  You will be contacted with the lab results as soon as they are available. The fastest way to get your results is to activate your  My Chart account. Instructions are located on the last page of this paperwork. If you have not heard from [place] regarding the results in 2 weeks, please contact this office.      I personally performed the services described in this documentation, which was scribed in my presence. The recorded information has been reviewed and considered for accuracy and completeness, addended by me as needed, and agree with information above.  Signed,   Merri Ray, MD [facility] [place].  06/09/16 1:09 PM

## 2016-11-27 ENCOUNTER — Other Ambulatory Visit: Payer: Self-pay | Admitting: Family Medicine

## 2016-11-27 DIAGNOSIS — I10 Essential (primary) hypertension: Secondary | ICD-10-CM

## 2017-01-08 ENCOUNTER — Telehealth: Payer: Self-pay | Admitting: Family Medicine

## 2017-01-08 NOTE — Telephone Encounter (Signed)
PATIENT WANTS DR. Neva Seat TO KNOW HE NEEDS A REFILL ON HIS LOSARTAN-HYDROCHLOROTHIAZIDE (HYZAAR) 50-12.5 MG. HE SAID HE TOOK HIS LAST PILL ON THUR. OR FRI. OF LAST WEEK. (I DID EXPLAIN OUR REFILL POLICY BUT I WILL PUT MESSAGE AS HIGH PRIORITY). BEST PHONE [phone] (CELL) PHARMACY CHOICE IS [facility]. MBC

## 2017-01-08 NOTE — Telephone Encounter (Signed)
Informed patient he should have 2 more refills and will need to get in before this prescription runs out

## 2017-04-20 ENCOUNTER — Ambulatory Visit (INDEPENDENT_AMBULATORY_CARE_PROVIDER_SITE_OTHER): Payer: Self-pay | Admitting: Urgent Care

## 2017-04-20 ENCOUNTER — Encounter: Payer: Self-pay | Admitting: Urgent Care

## 2017-04-20 VITALS — BP 169/109 | HR 60 | Temp 97.8°F | Resp 18 | Ht 66.0 in | Wt 226.2 lb

## 2017-04-20 DIAGNOSIS — Z6832 Body mass index (BMI) 32.0-32.9, adult: Secondary | ICD-10-CM

## 2017-04-20 DIAGNOSIS — I10 Essential (primary) hypertension: Secondary | ICD-10-CM

## 2017-04-20 DIAGNOSIS — E782 Mixed hyperlipidemia: Secondary | ICD-10-CM

## 2017-04-20 DIAGNOSIS — R0789 Other chest pain: Secondary | ICD-10-CM

## 2017-04-20 DIAGNOSIS — E669 Obesity, unspecified: Secondary | ICD-10-CM

## 2017-04-20 MED ORDER — LOSARTAN POTASSIUM 100 MG PO TABS: 100.0000 mg | ORAL_TABLET | Freq: Every day | ORAL | 3 refills | Status: DC

## 2017-04-20 MED ORDER — AMLODIPINE BESYLATE 5 MG PO TABS: 5.0000 mg | ORAL_TABLET | Freq: Every day | ORAL | 3 refills | Status: DC

## 2017-04-20 MED ORDER — ATORVASTATIN CALCIUM 10 MG PO TABS: 10.0000 mg | ORAL_TABLET | Freq: Every day | ORAL | 3 refills | Status: DC

## 2017-04-20 NOTE — Progress Notes (Addendum)
   MRN: [id number] DOB: 1965-12-03  Subjective:   Jacob Kelley is a 52 y.o. male presenting for follow up on Hypertension. Currently managed with lisinopril. Patient is not checking blood pressure at home. Reports having intermittent and transient mid-sternal to epigastric chest pain mostly at night lasting a few seconds but wakes him out of his sleep at times. Has also felt some tingling in his left arm. Admits that he is not compliant with his los-HCTZ, has had dizziness at night even on days that he does take his medication. He is worried about the recall with HCTZ and wants a different BP medication. Denies dizziness, chronic headache, shortness of breath, heart racing, palpitations, nausea, vomiting, abdominal pain, hematuria, lower leg swelling. Denies smoking cigarettes. Has a glass of wine daily. Of note, patient is very anxious due to having lost a very close friend recently, he had an MI and is very worried that this will happen with him as well.   Jacob Kelley has a current medication list which includes the following prescription(s): losartan-hydrochlorothiazide. Also is allergic to ibuprofen.  Jacob Kelley  has a past medical history of Allergy and Hypertension. Denies past surgical history.   Objective:   Vitals: BP (!) 169/109   Pulse 60   Temp 97.8 F (36.6 C) (Oral)   Resp 18   Ht 5\' 6"  (1.676 m)   Wt 226 lb 3.2 oz (102.6 kg)   SpO2 94%   BMI 36.51 kg/m   The 10-year ASCVD risk score Jacob George(Goff DC Jr., et al., 2013) is: 8.4%   Values used to calculate the score:     Age: [age] years     Sex: Male     Is Non-Hispanic African American: No     Diabetic: No     Tobacco smoker: No     Systolic Blood Pressure: 169 mmHg     Is BP treated: Yes     HDL Cholesterol: 42 mg/dL     Total Cholesterol: 210 mg/dL  Physical Exam  Constitutional: He is oriented to person, place, and time. He appears well-developed and well-nourished.  HENT:  Mouth/Throat: Oropharynx is clear and moist.  Eyes:  Right eye exhibits no discharge. Left eye exhibits no discharge. No scleral icterus.  Cardiovascular: Normal rate, regular rhythm and intact distal pulses. Exam reveals no gallop and no friction rub.  No murmur heard. Pulmonary/Chest: No respiratory distress. He has no wheezes. He has no rales.  Abdominal: Soft. Bowel sounds are normal. He exhibits no distension and no mass. There is no tenderness. There is no guarding.  Musculoskeletal: He exhibits no edema.  Neurological: He is alert and oriented to person, place, and time.  Skin: Skin is warm and dry.   ECG interpretation - sinus bradycardia at 58 without any acute changes compared to ecg from 12/11/2015.  Assessment and Plan :   Essential hypertension - Plan: Comprehensive metabolic panel, Microalbumin / creatinine urine ratio  Class 1 obesity without serious comorbidity with body mass index (BMI) of 32.0 to 32.9 in adult, unspecified obesity type - Plan: Lipid panel, Hemoglobin A1c  Atypical chest pain - Plan: EKG 12-Lead  Mixed hyperlipidemia   Stop los-HCTZ. Start losartan, amlodipine. Recommended lifestyle modifications. Start atorvastatin. Monitor chest pain, labs pending. Return-to-clinic precautions discussed, patient verbalized understanding.   Wallis BambergMario Lumina Gitto, PA-C [facility] [phone] 04/20/2017  8:19 AM

## 2017-04-20 NOTE — Patient Instructions (Addendum)
For this first week, please take only losartan for your blood pressure. After this week, you can start both losartan and amlodipine. Atorvastatin will be for cholesterol and helping against heart disease. You can start this today.   Hypertension Hypertension is another name for high blood pressure. High blood pressure forces your heart to work harder to pump blood. This can cause problems over time. There are two numbers in a blood pressure reading. There is a top number (systolic) over a bottom number (diastolic). It is best to have a blood pressure below 120/80. Healthy choices can help lower your blood pressure. You may need medicine to help lower your blood pressure if:  Your blood pressure cannot be lowered with healthy choices.  Your blood pressure is higher than 130/80.  Follow these instructions at home: Eating and drinking  If directed, follow the DASH eating plan. This diet includes: ? Filling half of your plate at each meal with fruits and vegetables. ? Filling one quarter of your plate at each meal with whole grains. Whole grains include whole wheat pasta, brown rice, and whole grain bread. ? Eating or drinking low-fat dairy products, such as skim milk or low-fat yogurt. ? Filling one quarter of your plate at each meal with low-fat (lean) proteins. Low-fat proteins include fish, skinless chicken, eggs, beans, and tofu. ? Avoiding fatty meat, cured and processed meat, or chicken with skin. ? Avoiding premade or processed food.  Eat less than 1,500 mg of salt (sodium) a day.  Limit alcohol use to no more than 1 drink a day for nonpregnant women and 2 drinks a day for men. One drink equals 12 oz of beer, 5 oz of wine, or 1 oz of hard liquor. Lifestyle  Work with your doctor to stay at a healthy weight or to lose weight. Ask your doctor what the best weight is for you.  Get at least 30 minutes of exercise that causes your heart to beat faster (aerobic exercise) most days of the  week. This may include walking, swimming, or biking.  Get at least 30 minutes of exercise that strengthens your muscles (resistance exercise) at least 3 days a week. This may include lifting weights or pilates.  Do not use any products that contain nicotine or tobacco. This includes cigarettes and e-cigarettes. If you need help quitting, ask your doctor.  Check your blood pressure at home as told by your doctor.  Keep all follow-up visits as told by your doctor. This is important. Medicines  Take over-the-counter and prescription medicines only as told by your doctor. Follow directions carefully.  Do not skip doses of blood pressure medicine. The medicine does not work as well if you skip doses. Skipping doses also puts you at risk for problems.  Ask your doctor about side effects or reactions to medicines that you should watch for. Contact a doctor if:  You think you are having a reaction to the medicine you are taking.  You have headaches that keep coming back (recurring).  You feel dizzy.  You have swelling in your ankles.  You have trouble with your vision. Get help right away if:  You get a very bad headache.  You start to feel confused.  You feel weak or numb.  You feel faint.  You get very bad pain in your: ? Chest. ? Belly (abdomen).  You throw up (vomit) more than once.  You have trouble breathing. Summary  Hypertension is another name for high blood pressure.  Making healthy choices can help lower blood pressure. If your blood pressure cannot be controlled with healthy choices, you may need to take medicine. This information is not intended to replace advice given to you by your health care provider. Make sure you discuss any questions you have with your health care provider. Document Released: 09/20/2007 Document Revised: 03/01/2016 Document Reviewed: 03/01/2016 Elsevier Interactive Patient Education  2018 [employer].    Losartan tablets What is  this medicine? LOSARTAN (loe SAR tan) is used to treat high blood pressure and to reduce the risk of stroke in certain patients. This drug also slows the progression of kidney disease in patients with diabetes. This medicine may be used for other purposes; ask your health care provider or pharmacist if you have questions. COMMON BRAND NAME(S): Cozaar What should I tell my health care provider before I take this medicine? They need to know if you have any of these conditions: -heart failure -kidney or liver disease -an unusual or allergic reaction to losartan, other medicines, foods, dyes, or preservatives -pregnant or trying to get pregnant -breast-feeding How should I use this medicine? Take this medicine by mouth with a glass of water. Follow the directions on the prescription label. This medicine can be taken with or without food. Take your doses at regular intervals. Do not take your medicine more often than directed. Talk to your pediatrician regarding the use of this medicine in children. Special care may be needed. Overdosage: If you think you have taken too much of this medicine contact a poison control center or emergency room at once. NOTE: This medicine is only for you. Do not share this medicine with others. What if I miss a dose? If you miss a dose, take it as soon as you can. If it is almost time for your next dose, take only that dose. Do not take double or extra doses. What may interact with this medicine? -blood pressure medicines -diuretics, especially triamterene, spironolactone, or amiloride -fluconazole -NSAIDs, medicines for pain and inflammation, like ibuprofen or naproxen -potassium salts or potassium supplements -rifampin This list may not describe all possible interactions. Give your health care provider a list of all the medicines, herbs, non-prescription drugs, or dietary supplements you use. Also tell them if you smoke, drink alcohol, or use illegal drugs. Some  items may interact with your medicine. What should I watch for while using this medicine? Visit your doctor or health care professional for regular checks on your progress. Check your blood pressure as directed. Ask your doctor or health care professional what your blood pressure should be and when you should contact him or her. Call your doctor or health care professional if you notice an irregular or fast heart beat. Women should inform their doctor if they wish to become pregnant or think they might be pregnant. There is a potential for serious side effects to an unborn child, particularly in the second or third trimester. Talk to your health care professional or pharmacist for more information. You may get drowsy or dizzy. Do not drive, use machinery, or do anything that needs mental alertness until you know how this drug affects you. Do not stand or sit up quickly, especially if you are an older patient. This reduces the risk of dizzy or fainting spells. Alcohol can make you more drowsy and dizzy. Avoid alcoholic drinks. Avoid salt substitutes unless you are told otherwise by your doctor or health care professional. Do not treat yourself for coughs, colds, or pain  while you are taking this medicine without asking your doctor or health care professional for advice. Some ingredients may increase your blood pressure. What side effects may I notice from receiving this medicine? Side effects that you should report to your doctor or health care professional as soon as possible: -confusion, dizziness, light headedness or fainting spells -decreased amount of urine passed -difficulty breathing or swallowing, hoarseness, or tightening of the throat -fast or irregular heart beat, palpitations, or chest pain -skin rash, itching -swelling of your face, lips, tongue, hands, or feet Side effects that usually do not require medical attention (report to your doctor or health care professional if they continue or  are bothersome): -cough -decreased sexual function or desire -headache -nasal congestion or stuffiness -nausea or stomach pain -sore or cramping muscles This list may not describe all possible side effects. Call your doctor for medical advice about side effects. You may report side effects to FDA at 1-800-FDA-1088. Where should I keep my medicine? Keep out of the reach of children. Store at room temperature between 15 and 30 degrees C (59 and 86 degrees F). Protect from light. Keep container tightly closed. Throw away any unused medicine after the expiration date. NOTE: This sheet is a summary. It may not cover all possible information. If you have questions about this medicine, talk to your doctor, pharmacist, or health care provider.  2018 Elsevier/Gold Standard (2007-06-14 16:42:18)    Amlodipine tablets What is this medicine? AMLODIPINE (am LOE di peen) is a calcium-channel blocker. It affects the amount of calcium found in your heart and muscle cells. This relaxes your blood vessels, which can reduce the amount of work the heart has to do. This medicine is used to lower high blood pressure. It is also used to prevent chest pain. This medicine may be used for other purposes; ask your health care provider or pharmacist if you have questions. COMMON BRAND NAME(S): Norvasc What should I tell my health care provider before I take this medicine? They need to know if you have any of these conditions: -heart problems like heart failure or aortic stenosis -liver disease -an unusual or allergic reaction to amlodipine, other medicines, foods, dyes, or preservatives -pregnant or trying to get pregnant -breast-feeding How should I use this medicine? Take this medicine by mouth with a glass of water. Follow the directions on the prescription label. Take your medicine at regular intervals. Do not take more medicine than directed. Talk to your pediatrician regarding the use of this medicine in  children. Special care may be needed. This medicine has been used in children as young as 6. Persons over 46 years old may have a stronger reaction to this medicine and need smaller doses. Overdosage: If you think you have taken too much of this medicine contact a poison control center or emergency room at once. NOTE: This medicine is only for you. Do not share this medicine with others. What if I miss a dose? If you miss a dose, take it as soon as you can. If it is almost time for your next dose, take only that dose. Do not take double or extra doses. What may interact with this medicine? -herbal or dietary supplements -local or general anesthetics -medicines for high blood pressure -medicines for prostate problems -rifampin This list may not describe all possible interactions. Give your health care provider a list of all the medicines, herbs, non-prescription drugs, or dietary supplements you use. Also tell them if you smoke, drink alcohol, or  use illegal drugs. Some items may interact with your medicine. What should I watch for while using this medicine? Visit your doctor or health care professional for regular check ups. Check your blood pressure and pulse rate regularly. Ask your health care professional what your blood pressure and pulse rate should be, and when you should contact him or her. This medicine may make you feel confused, dizzy or lightheaded. Do not drive, use machinery, or do anything that needs mental alertness until you know how this medicine affects you. To reduce the risk of dizzy or fainting spells, do not sit or stand up quickly, especially if you are an older patient. Avoid alcoholic drinks; they can make you more dizzy. Do not suddenly stop taking amlodipine. Ask your doctor or health care professional how you can gradually reduce the dose. What side effects may I notice from receiving this medicine? Side effects that you should report to your doctor or health care  professional as soon as possible: -allergic reactions like skin rash, itching or hives, swelling of the face, lips, or tongue -breathing problems -changes in vision or hearing -chest pain -fast, irregular heartbeat -swelling of legs or ankles Side effects that usually do not require medical attention (report to your doctor or health care professional if they continue or are bothersome): -dry mouth -facial flushing -nausea, vomiting -stomach gas, pain -tired, weak -trouble sleeping This list may not describe all possible side effects. Call your doctor for medical advice about side effects. You may report side effects to FDA at 1-800-FDA-1088. Where should I keep my medicine? Keep out of the reach of children. Store at room temperature between 59 and 86 degrees F (15 and 30 degrees C). Protect from light. Keep container tightly closed. Throw away any unused medicine after the expiration date. NOTE: This sheet is a summary. It may not cover all possible information. If you have questions about this medicine, talk to your doctor, pharmacist, or health care provider.  2018 Elsevier/Gold Standard (2012-03-01 11:40:58)    Atorvastatin tablets What is this medicine? ATORVASTATIN (a TORE va sta tin) is known as a HMG-CoA reductase inhibitor or 'statin'. It lowers the level of cholesterol and triglycerides in the blood. This drug may also reduce the risk of heart attack, stroke, or other health problems in patients with risk factors for heart disease. Diet and lifestyle changes are often used with this drug. This medicine may be used for other purposes; ask your health care provider or pharmacist if you have questions. COMMON BRAND NAME(S): Lipitor What should I tell my health care provider before I take this medicine? They need to know if you have any of these conditions: -frequently drink alcoholic beverages -history of stroke, TIA -kidney disease -liver disease -muscle aches or  weakness -other medical condition -an unusual or allergic reaction to atorvastatin, other medicines, foods, dyes, or preservatives -pregnant or trying to get pregnant -breast-feeding How should I use this medicine? Take this medicine by mouth with a glass of water. Follow the directions on the prescription label. You can take this medicine with or without food. Take your doses at regular intervals. Do not take your medicine more often than directed. Talk to your pediatrician regarding the use of this medicine in children. While this drug may be prescribed for children as young as 32 years old for selected conditions, precautions do apply. Overdosage: If you think you have taken too much of this medicine contact a poison control center or emergency room at once.  NOTE: This medicine is only for you. Do not share this medicine with others. What if I miss a dose? If you miss a dose, take it as soon as you can. If it is almost time for your next dose, take only that dose. Do not take double or extra doses. What may interact with this medicine? Do not take this medicine with any of the following medications: -red yeast rice -telaprevir -telithromycin -voriconazole This medicine may also interact with the following medications: -alcohol -antiviral medicines for HIV or AIDS -boceprevir -certain antibiotics like clarithromycin, erythromycin, troleandomycin -certain medicines for cholesterol like fenofibrate or gemfibrozil -cimetidine -clarithromycin -colchicine -cyclosporine -digoxin -male hormones, like estrogens or progestins and birth control pills -grapefruit juice -medicines for fungal infections like fluconazole, itraconazole, ketoconazole -niacin -rifampin -spironolactone This list may not describe all possible interactions. Give your health care provider a list of all the medicines, herbs, non-prescription drugs, or dietary supplements you use. Also tell them if you smoke, drink  alcohol, or use illegal drugs. Some items may interact with your medicine. What should I watch for while using this medicine? Visit your doctor or health care professional for regular check-ups. You may need regular tests to make sure your liver is working properly. Tell your doctor or health care professional right away if you get any unexplained muscle pain, tenderness, or weakness, especially if you also have a fever and tiredness. Your doctor or health care professional may tell you to stop taking this medicine if you develop muscle problems. If your muscle problems do not go away after stopping this medicine, contact your health care professional. This drug is only part of a total heart-health program. Your doctor or a dietician can suggest a low-cholesterol and low-fat diet to help. Avoid alcohol and smoking, and keep a proper exercise schedule. Do not use this drug if you are pregnant or breast-feeding. Serious side effects to an unborn child or to an infant are possible. Talk to your doctor or pharmacist for more information. This medicine may affect blood sugar levels. If you have diabetes, check with your doctor or health care professional before you change your diet or the dose of your diabetic medicine. If you are going to have surgery tell your health care professional that you are taking this drug. What side effects may I notice from receiving this medicine? Side effects that you should report to your doctor or health care professional as soon as possible: -allergic reactions like skin rash, itching or hives, swelling of the face, lips, or tongue -dark urine -fever -joint pain -muscle cramps, pain -redness, blistering, peeling or loosening of the skin, including inside the mouth -trouble passing urine or change in the amount of urine -unusually weak or tired -yellowing of eyes or skin Side effects that usually do not require medical attention (report to your doctor or health care  professional if they continue or are bothersome): -constipation -heartburn -stomach gas, pain, upset This list may not describe all possible side effects. Call your doctor for medical advice about side effects. You may report side effects to FDA at 1-800-FDA-1088. Where should I keep my medicine? Keep out of the reach of children. Store at room temperature between 20 to 25 degrees C (68 to 77 degrees F). Throw away any unused medicine after the expiration date. NOTE: This sheet is a summary. It may not cover all possible information. If you have questions about this medicine, talk to your doctor, pharmacist, or health care provider.  2018 Elsevier/Gold  Standard (2011-02-21 09:18:24)     IF you received an x-ray today, you will receive an invoice from [facility]. Please contact [facility] at [phone] with questions or concerns regarding your invoice.   IF you received labwork today, you will receive an invoice from [facility]. Please contact [facility] at [phone] with questions or concerns regarding your invoice.   Our billing staff will not be able to assist you with questions regarding bills from these companies.  You will be contacted with the lab results as soon as they are available. The fastest way to get your results is to activate your My Chart account. Instructions are located on the last page of this paperwork. If you have not heard from [place] regarding the results in 2 weeks, please contact this office.

## 2017-04-21 LAB — COMPREHENSIVE METABOLIC PANEL
ALBUMIN: 4.2 g/dL (ref 3.5–5.5)
ALT: 26 IU/L (ref 0–44)
AST: 21 IU/L (ref 0–40)
Albumin/Globulin Ratio: 1.8 (ref 1.2–2.2)
Alkaline Phosphatase: 77 IU/L (ref 39–117)
BILIRUBIN TOTAL: 0.4 mg/dL (ref 0.0–1.2)
BUN / CREAT RATIO: 17 (ref 9–20)
BUN: 14 mg/dL (ref 6–24)
CALCIUM: 9.4 mg/dL (ref 8.7–10.2)
CHLORIDE: 106 mmol/L (ref 96–106)
CO2: 20 mmol/L (ref 20–29)
CREATININE: 0.82 mg/dL (ref 0.76–1.27)
GFR, EST AFRICAN AMERICAN: 119 mL/min/{1.73_m2} (ref >59)
GFR, EST NON AFRICAN AMERICAN: 103 mL/min/{1.73_m2} (ref >59)
GLUCOSE: 94 mg/dL (ref 65–99)
Globulin, Total: 2.4 g/dL (ref 1.5–4.5)
Potassium: 4.3 mmol/L (ref 3.5–5.2)
Sodium: 143 mmol/L (ref 134–144)
TOTAL PROTEIN: 6.6 g/dL (ref 6.0–8.5)

## 2017-04-21 LAB — LIPID PANEL
CHOL/HDL RATIO: 5 ratio (ref 0.0–5.0)
Cholesterol, Total: 210 mg/dL — ABNORMAL HIGH (ref 100–199)
HDL: 42 mg/dL (ref >39)
LDL CALC: 152 mg/dL — AB (ref 0–99)
Triglycerides: 80 mg/dL (ref 0–149)
VLDL CHOLESTEROL CAL: 16 mg/dL (ref 5–40)

## 2017-04-21 LAB — MICROALBUMIN / CREATININE URINE RATIO
Creatinine, Urine: 187.3 mg/dL
Microalb/Creat Ratio: 2.7 mg/g creat (ref 0.0–30.0)
Microalbumin, Urine: 5 ug/mL

## 2017-04-21 LAB — HEMOGLOBIN A1C
Est. average glucose Bld gHb Est-mCnc: 114 mg/dL
HEMOGLOBIN A1C: 5.6 % (ref 4.8–5.6)

## 2017-05-01 ENCOUNTER — Encounter: Payer: Self-pay | Admitting: Urgent Care

## 2017-05-24 ENCOUNTER — Ambulatory Visit: Payer: Self-pay | Admitting: Urgent Care

## 2017-05-29 ENCOUNTER — Ambulatory Visit (INDEPENDENT_AMBULATORY_CARE_PROVIDER_SITE_OTHER): Payer: Self-pay | Admitting: Urgent Care

## 2017-05-29 ENCOUNTER — Encounter: Payer: Self-pay | Admitting: Urgent Care

## 2017-05-29 VITALS — BP 132/82 | HR 68 | Temp 97.6°F | Resp 17 | Ht 67.5 in | Wt 219.0 lb

## 2017-05-29 DIAGNOSIS — E782 Mixed hyperlipidemia: Secondary | ICD-10-CM

## 2017-05-29 DIAGNOSIS — R1013 Epigastric pain: Secondary | ICD-10-CM

## 2017-05-29 DIAGNOSIS — I10 Essential (primary) hypertension: Secondary | ICD-10-CM

## 2017-05-29 MED ORDER — OMEPRAZOLE 20 MG PO CPDR: 20.0000 mg | DELAYED_RELEASE_CAPSULE | Freq: Every day | ORAL | 3 refills | Status: DC

## 2017-05-29 MED ORDER — HYDROCHLOROTHIAZIDE 12.5 MG PO TABS: 12.5000 mg | ORAL_TABLET | Freq: Every day | ORAL | 3 refills | Status: DC

## 2017-05-29 MED ORDER — RANITIDINE HCL 150 MG PO TABS: 150.0000 mg | ORAL_TABLET | Freq: Two times a day (BID) | ORAL | 1 refills | Status: DC

## 2017-05-29 NOTE — Progress Notes (Signed)
    MRN: [id number] DOB: 09-Feb-1966  Subjective:   Jacob Kelley is a 52 y.o. male presenting for follow up on Hypertension. Currently managed with amlodipine, losartan. Has started avoiding salt in diet, is exercising and trying to eat much healthier. Reports that he has lower leg swelling, would like to come off of amlodipine and restart HCTZ. Also continues to have epigastric pain, eats acidic foods. Also has bloating. Denies dizziness, chronic headache, chest pain, shortness of breath, heart racing, palpitations, nausea, vomiting, hematuria, lower leg swelling. Denies smoking cigarettes.   Jacob Kelley has a current medication list which includes the following prescription(s): amlodipine and losartan. Also is allergic to ibuprofen.  Jacob Kelley  has a past medical history of Allergy and Hypertension. Denies past surgical history.  Objective:   Vitals: BP 132/82   Pulse 68   Temp 97.6 F (36.4 C) (Oral)   Resp 17   Ht 5' 7.5" (1.715 m)   Wt 219 lb (99.3 kg)   SpO2 98%   BMI 33.79 kg/m   Wt Readings from Last 3 Encounters:  05/29/17 219 lb (99.3 kg)  04/20/17 226 lb 3.2 oz (102.6 kg)  06/08/16 213 lb (96.6 kg)    BP Readings from Last 3 Encounters:  05/29/17 132/82  04/20/17 (!) 169/109  06/08/16 128/82   The 10-year ASCVD risk score [name](Goff DC Jr., et al., 2013) is: 5.5%   Values used to calculate the score:     Age: [age] years     Sex: Male     Is Non-Hispanic African American: No     Diabetic: No     Tobacco smoker: No     Systolic Blood Pressure: 132 mmHg     Is BP treated: Yes     HDL Cholesterol: 42 mg/dL     Total Cholesterol: 210 mg/dL   Physical Exam  Constitutional: He is oriented to person, place, and time. He appears well-developed and well-nourished.  HENT:  Mouth/Throat: Oropharynx is clear and moist.  Eyes: No scleral icterus.  Cardiovascular: Normal rate, regular rhythm and intact distal pulses. Exam reveals no gallop and no friction rub.  No murmur  heard. Pulmonary/Chest: No respiratory distress. He has no wheezes. He has no rales.  Abdominal: Soft. Bowel sounds are normal. He exhibits no distension and no mass. There is tenderness (epigastric). There is no guarding.  Musculoskeletal: He exhibits no edema.  Neurological: He is alert and oriented to person, place, and time.  Skin: Skin is warm and dry.  Psychiatric: He has a normal mood and affect.    Assessment and Plan :   Abdominal pain, epigastric - Plan: H. pylori breath test  Essential hypertension  Mixed hyperlipidemia  BP well controlled. Restart HCTZ. Stop amlodipine. Stop statin. Start Prilosec with Zantac. Check for h. Pylori pending. Return-to-clinic precautions discussed, patient verbalized understanding. Otherwise, follow up in 6 months.  Wallis BambergMario Arlester Keehan, PA-C [facility] [phone] 05/29/2017  9:22 AM

## 2017-05-29 NOTE — Patient Instructions (Addendum)
Puedes usar 150mg  Zantac (ranitidine) dos veces diaramente.    Opciones de alimentos para pacientes adultos con enfermedad de reflujo gastroesofgico Food Choices for Gastroesophageal Reflux Disease, Adult Si tiene enfermedad de reflujo gastroesofgico (ERGE), los alimentos que consume y los hbitos de alimentacin son muy importantes. Elegir los alimentos adecuados puede ayudar a Paramedicaliviar las molestias ocasionadas por la Hartford FinancialERGE. Considere la posibilidad de trabajar con un especialista en dieta y nutricin (nutricionista) que lo ayude a Software engineerhacer elecciones saludables. Qu pautas generales debo seguir? Plan de alimentacin  Elija alimentos saludables con bajo contenido de grasa, como frutas, verduras, cereales integrales, productos lcteos descremados, carne magra de vaca, de pescado y de ave.  Haga comidas pequeas con frecuencia en lugar de tres comidas abundantes al da. Coma lentamente, en un ambiente distendido. Evite agacharse o recostarse hasta despus de 2 o 3horas de haber comido.  Limite los alimentos con alto contenido graso como las carnes grasas o los alimentos fritos.  Limite el consumo de Crestlineaceites, Snellingmanteca y Indiamargarina a menos de 8 cucharaditas al Futures traderda.  Evite lo siguiente: ? Consumir alimentos que le ocasionen sntomas. Pueden ser distintos para cada persona. Lleve un registro de los alimentos para identificar aquellos que le ocasionen sntomas. ? Consumir alcohol. ? Beber grandes cantidades de lquido con las comidas. ? Comer 2 o 3 horas antes de acostarse.  Cocine los alimentos utilizando mtodos que no sean la fritura. Esto puede Writerincluir hornear, Technical sales engineeremparrillar y hervir. Estilo de vida   Mantenga un peso saludable. Pregunte a su mdico cul es el peso saludable para usted. Si debe perder peso, hable con su mdico para hacerlo de manera segura.  Realice actividad fsica durante, al menos, 30 minutos 5 das por semana o ms, o segn lo indicado por su mdico.  Evite usar ropa  ajustada alrededor de la cintura y Naval architectel pecho.  No consuma ningn producto que contenga nicotina o tabaco, como cigarrillos y Administrator, Civil Servicecigarrillos electrnicos. Si necesita ayuda para dejar de fumar, consulte al mdico.  Duerma con la cabecera de la cama elevada. Use una cua debajo del colchn o bloques debajo del armazn de la cama para Pharmacologistmantener la cabecera de la cama elevada. Qu alimentos no se recomiendan? Esta podra no ser Raytheonuna lista completa. Hable con el nutricionista sobre las mejores opciones alimenticias para usted. Carbohidratos Pasteles o panes sin levadura con grasa agregada. Tostadas francesas. Verduras Verduras fritas en abundante aceite. Papas fritas. Cualquier verdura que est preparada con grasa agregada. Cualquier verdura que le ocasione sntomas. Para algunas personas, estas pueden incluir tomates y productos con tomate, Hillsboroajes, cebollas y [place] Hillsajo, y rbanos picantes. Frutas Cualquier fruta que est preparada con grasa agregada. Cualquier fruta que le ocasione sntomas. Para algunas personas, estas pueden incluir, las frutas ctricas como naranja, pomelo, pia y limn. Carnes y otros alimentos ricos en protenas Carnes de alto contenido graso como carne grasa de vaca o cerdo, salchichas, costillas, Lakewood Villagejamn, salchicha, salame y tocino. Carnes o protenas fritas, lo que incluye pescado frito y pollo frito. Nueces y Danversmantequillas de frutos secos. Lcteos Leche entera y Bullardleche con chocolate. PPG IndustriesCrema cida. Crema. Helados. Queso crema. Batidos con WPS Resourcesleche. Bebidas Caf y t negro, con o sin cafena. Bebidas carbonatadas. Refrescos. Bebidas energizantes. Jugo de fruta hecho con frutas cidas (como naranja o pomelo). Jugo de tomate. Bebidas alcohlicas. Grasas y Barnes & Nobleaceites Mantequilla. Margarina. Lardo. Mantequilla clarificada. Dulces y postres Chocolate y cacao. Rosquillas. Condimentos y otros alimentos TyroneshirePimienta. Menta y mentol. Cualquier condimento, hierbas o aderezos que le ocasionen sntomas.  Para  algunas personas, esto puede incluir curry, salsa picante o aderezos para ensalada a base de vinagre. Resumen  Si tiene enfermedad de reflujo gastroesofgico (ERGE), las elecciones de alimentos y [place] de vida son muy importantes para ayudar a Paramedic las molestias de la [place].  Haga comidas pequeas con frecuencia en lugar de tres comidas abundantes al da. Coma lentamente, en un ambiente distendido. Evite agacharse o recostarse hasta despus de 2 o 3horas de haber comido.  Limite los alimentos con alto contenido graso como la carne grasa o los alimentos fritos. Esta informacin no tiene Theme park manager el consejo del mdico. Asegrese de hacerle al mdico cualquier pregunta que tenga. Document Released: 01/11/2005 Document Revised: 07/24/2016 Document Reviewed: 02/05/2013 Elsevier Interactive Patient Education  2018 [employer].     IF you received an x-ray today, you will receive an invoice from [facility]. Please contact [facility] at [phone] with questions or concerns regarding your invoice.   IF you received labwork today, you will receive an invoice from [facility]. Please contact [facility] at [phone] with questions or concerns regarding your invoice.   Our billing staff will not be able to assist you with questions regarding bills from these companies.  You will be contacted with the lab results as soon as they are available. The fastest way to get your results is to activate your My Chart account. Instructions are located on the last page of this paperwork. If you have not heard from [place] regarding the results in 2 weeks, please contact this office.

## 2017-06-01 ENCOUNTER — Other Ambulatory Visit: Payer: Self-pay | Admitting: Urgent Care

## 2017-06-01 LAB — H. PYLORI BREATH TEST: H pylori Breath Test: POSITIVE — AB

## 2017-06-01 MED ORDER — AMOXICILLIN 500 MG PO CAPS: 1000.0000 mg | ORAL_CAPSULE | Freq: Two times a day (BID) | ORAL | 0 refills | Status: DC

## 2017-06-01 MED ORDER — OMEPRAZOLE 20 MG PO CPDR: 20.0000 mg | DELAYED_RELEASE_CAPSULE | Freq: Two times a day (BID) | ORAL | 1 refills | Status: DC

## 2017-06-01 MED ORDER — CLARITHROMYCIN 500 MG PO TABS: 500.0000 mg | ORAL_TABLET | Freq: Two times a day (BID) | ORAL | 0 refills | Status: DC

## 2017-07-26 ENCOUNTER — Encounter: Payer: Self-pay | Admitting: Gastroenterology

## 2017-07-26 ENCOUNTER — Ambulatory Visit (INDEPENDENT_AMBULATORY_CARE_PROVIDER_SITE_OTHER): Payer: Self-pay | Admitting: Urgent Care

## 2017-07-26 ENCOUNTER — Encounter: Payer: Self-pay | Admitting: Urgent Care

## 2017-07-26 ENCOUNTER — Other Ambulatory Visit: Payer: Self-pay

## 2017-07-26 VITALS — BP 141/85 | HR 75 | Temp 98.4°F | Resp 18 | Ht 67.5 in | Wt 217.6 lb

## 2017-07-26 DIAGNOSIS — I10 Essential (primary) hypertension: Secondary | ICD-10-CM

## 2017-07-26 DIAGNOSIS — R8290 Unspecified abnormal findings in urine: Secondary | ICD-10-CM

## 2017-07-26 DIAGNOSIS — Z23 Encounter for immunization: Secondary | ICD-10-CM

## 2017-07-26 DIAGNOSIS — Z1211 Encounter for screening for malignant neoplasm of colon: Secondary | ICD-10-CM

## 2017-07-26 DIAGNOSIS — R1013 Epigastric pain: Secondary | ICD-10-CM

## 2017-07-26 DIAGNOSIS — Z8619 Personal history of other infectious and parasitic diseases: Secondary | ICD-10-CM

## 2017-07-26 DIAGNOSIS — Z9109 Other allergy status, other than to drugs and biological substances: Secondary | ICD-10-CM

## 2017-07-26 MED ORDER — FLUCONAZOLE 150 MG PO TABS: 150.0000 mg | ORAL_TABLET | ORAL | 0 refills | Status: DC

## 2017-07-26 MED ORDER — PSEUDOEPHEDRINE HCL 60 MG PO TABS: 60.0000 mg | ORAL_TABLET | Freq: Three times a day (TID) | ORAL | 0 refills | Status: DC | PRN

## 2017-07-26 MED ORDER — FEXOFENADINE HCL 180 MG PO TABS: 180.0000 mg | ORAL_TABLET | Freq: Every day | ORAL | 1 refills | Status: DC

## 2017-07-26 MED ORDER — OMEPRAZOLE 20 MG PO CPDR: 20.0000 mg | DELAYED_RELEASE_CAPSULE | Freq: Two times a day (BID) | ORAL | 1 refills | Status: DC

## 2017-07-26 NOTE — Patient Instructions (Addendum)
We will restart omeprazole for your reflux symptoms.  I want you to take the full dose of Allegra at 180 mg every day.  Pseudoephedrine is only supposed to be used as needed and with your history of high blood pressure we should just use 60 mg once every 8 hours.  Make sure you are hydrating very well with at least 2 L of water daily.  For your urinary symptom let us try to use Diflucan for a couple weeks.  We will call you to try make an appointment for your colonoscopy.    IF you received an x-ray today, you will receive an invoice from [facility] Radiology. Please contact [facility] Radiology at [phone] with questions or concerns regarding your invoice.   IF you received labwork today, you will receive an invoice from [place]. Please contact [facility] at [phone] with questions or concerns regarding your invoice.   Our billing staff will not be able to assist you with questions regarding bills from these companies.  You will be contacted with the lab results as soon as they are available. The fastest way to get your results is to activate your My Chart account. Instructions are located on the last page of this paperwork. If you have not heard from [place] regarding the results in 2 weeks, please contact this office.

## 2017-07-26 NOTE — Progress Notes (Signed)
    MRN: [id number] DOB: 1965-05-15  Subjective:   Jacob PittsSergio R Kelley is a 52 y.o. male presenting for allergies, penile issue, H pylori infection follow-up.  For his allergies patient has been using Allegra-D, 60 mg or Allegra and 120 mg for pseudoephedrine.  He has gotten sinus pain, sinus congestion, cough and sore throat but denies fever, chest pain, wheezing.  He does very well with the ephedrine but is concerned about how high it takes his blood pressure.  He hydrates very well.  He also states that he is worried about his colon from having taken eradication therapy for H. pylori.  States that he had some pretty bad constipation while on the medications.  He has never had a colonoscopy as he is wondering if this is necessary at this point.  He also reports that he has had malodorous urine since taking the antibiotics for his H. pylori infection.  Jacob Kelley has a current medication list which includes the following prescription(s): hydrochlorothiazide, losartan, and fexofenadine. Also is allergic to ibuprofen.  Jacob Kelley  has a past medical history of Allergy and Hypertension. Also  has no past surgical history on file.  Objective:   Vitals: BP (!) 141/85   Pulse 75   Temp 98.4 F (36.9 C) (Oral)   Resp 18   Ht 5' 7.5" (1.715 m)   Wt 217 lb 9.6 oz (98.7 kg)   SpO2 99%   BMI 33.58 kg/m   Physical Exam  Constitutional: He is oriented to person, place, and time. He appears well-developed and well-nourished.  HENT:  Right Ear: Tympanic membrane normal.  Left Ear: Tympanic membrane normal.  Nose: No sinus tenderness.  Mouth/Throat: Oropharynx is clear and moist.  Eyes: No scleral icterus.  Cardiovascular: Normal rate, regular rhythm and intact distal pulses. Exam reveals no gallop and no friction rub.  No murmur heard. Pulmonary/Chest: No respiratory distress. He has no wheezes. He has no rales.  Abdominal: Soft. Bowel sounds are normal. He exhibits no distension. There is no tenderness.  There is no rebound and no guarding.  Neurological: He is alert and oriented to person, place, and time.  Skin: Skin is warm and dry.  Psychiatric: He has a normal mood and affect.   Assessment and Plan :   Abdominal pain, epigastric  History of Helicobacter infection  Environmental allergies  Essential hypertension  Need for Tdap vaccination  Screen for colon cancer - Plan: Ambulatory referral to Gastroenterology  Malodorous urine - Plan: Yeast Only, Culture, GC/Chlamydia Probe Amp([facility]), Trichomonas vaginalis, RNA  Will restart patient on omeprazole for GERD-like symptoms.  Patient is to take Allegra at 180 mg and use Sudafed at 60 mg as needed.  His urinary symptom has unclear etiology but patient would like to try antifungal with Diflucan.  Labs pending.  Follow-up as needed.  Patient will be scheduled for screening colonoscopy.  Tdap updated today.  Jacob BambergMario Halimah Bewick, PA-C [facility] [phone] 07/26/2017  9:55 AM

## 2017-07-27 LAB — TRICHOMONAS VAGINALIS, PROBE AMP: Trich vag by NAA: NEGATIVE

## 2017-07-27 LAB — GC/CHLAMYDIA PROBE AMP
Chlamydia trachomatis, NAA: NEGATIVE
Neisseria gonorrhoeae by PCR: NEGATIVE

## 2017-08-03 LAB — YEAST ONLY, CULTURE

## 2017-10-04 ENCOUNTER — Encounter: Payer: Self-pay | Admitting: Gastroenterology

## 2018-04-22 ENCOUNTER — Other Ambulatory Visit: Payer: Self-pay | Admitting: Urgent Care

## 2018-05-30 ENCOUNTER — Other Ambulatory Visit: Payer: Self-pay | Admitting: Family Medicine

## 2018-05-30 NOTE — Telephone Encounter (Signed)
Copied from CRM [id number]. Topic: Quick Communication - Rx Refill/Question >> May 30, 2018  2:42 PM [place], [place] wrote: Medication: losartan (COZAAR) 100 MG tablet / hydrochlorothiazide (HYDRODIURIL) 12.5 MG tablet   Has the patient contacted their pharmacy? Yes.   (Agent: If no, request that the patient contact the pharmacy for the refill.) (Agent: If yes, when and what did the pharmacy advise?)  Preferred Pharmacy (with phone number or street name):[facility] [place] [address] [phone] (Phone) [phone] (Fax)  Agent: Please be advised that RX refills may take up to 3 business days. We ask that you follow-up with your pharmacy.

## 2018-05-30 NOTE — Telephone Encounter (Signed)
Call placed to patient via interpreter Onalee Hua [phone]. Appointment scheduled for medication check. Pt is requesting refills to bridge until OV.

## 2018-05-30 NOTE — Telephone Encounter (Signed)
Requested medication (s) are due for refill today: yes  Requested medication (s) are on the active medication list: yes  Last refill:  Losartan 04/23/2018, Hydrodiuril 05/29/17  Future visit scheduled: yes  Notes to clinic:  Pt is out of medication. Labs out of date. Pt requesting enough medication to bridge him to his appointment. Please advise patient.    Requested Prescriptions  Pending Prescriptions Disp Refills   losartan (COZAAR) 100 MG tablet 30 tablet 0    Sig: Take 1 tablet (100 mg total) by mouth daily.     Cardiovascular:  Angiotensin Receptor Blockers Failed - 05/30/2018  3:17 PM      Failed - Cr in normal range and within 180 days    Creat  Date Value Ref Range Status  11/22/2015 0.71 0.60 - 1.35 mg/dL Final   Creatinine, Ser  Date Value Ref Range Status  04/20/2017 0.82 0.76 - 1.27 mg/dL Final         Failed - K in normal range and within 180 days    Potassium  Date Value Ref Range Status  04/20/2017 4.3 3.5 - 5.2 mmol/L Final         Failed - Last BP in normal range    BP Readings from Last 1 Encounters:  07/26/17 (!) 141/85         Failed - Valid encounter within last 6 months    Recent Outpatient Visits          10 months ago Abdominal pain, epigastric   Primary Care at [facility], [place], PA-C   1 year ago Abdominal pain, epigastric   Primary Care at [facility], [place], [place]   1 year ago Essential hypertension   Primary Care at [facility], [place], [place]   1 year ago Essential hypertension   Primary Care at [place], [place] Partridge, [place]   2 years ago Essential hypertension   Primary Care at [place], [place], [place]      Future Appointments            In 3 weeks Shade Flood, MD Primary Care at [place], [facility]           Passed - Patient is not pregnant     hydrochlorothiazide (HYDRODIURIL) 12.5 MG tablet 90 tablet 3    Sig: Take 1 tablet (12.5 mg total) by mouth daily.     Cardiovascular: Diuretics - Thiazide Failed - 05/30/2018   3:17 PM      Failed - Ca in normal range and within 360 days    Calcium  Date Value Ref Range Status  04/20/2017 9.4 8.7 - 10.2 mg/dL Final         Failed - Cr in normal range and within 360 days    Creat  Date Value Ref Range Status  11/22/2015 0.71 0.60 - 1.35 mg/dL Final   Creatinine, Ser  Date Value Ref Range Status  04/20/2017 0.82 0.76 - 1.27 mg/dL Final         Failed - K in normal range and within 360 days    Potassium  Date Value Ref Range Status  04/20/2017 4.3 3.5 - 5.2 mmol/L Final         Failed - Na in normal range and within 360 days    Sodium  Date Value Ref Range Status  04/20/2017 143 134 - 144 mmol/L Final         Failed - Last BP in normal range    BP  Readings from Last 1 Encounters:  07/26/17 (!) 141/85         Failed - Valid encounter within last 6 months    Recent Outpatient Visits          10 months ago Abdominal pain, epigastric   Primary Care at [facility], [place], [place]   1 year ago Abdominal pain, epigastric   Primary Care at [facility], [place], [place]   1 year ago Essential hypertension   Primary Care at [facility], [place], [place]   1 year ago Essential hypertension   Primary Care at [place], Asencion Partridge, [place]   2 years ago Essential hypertension   Primary Care at [place], [place]      Future Appointments            In 3 weeks Neva Seat Asencion Partridge, MD Primary Care at [place], [facility]

## 2018-06-03 MED ORDER — LOSARTAN POTASSIUM 100 MG PO TABS: 100.0000 mg | ORAL_TABLET | Freq: Every day | ORAL | 0 refills | Status: DC

## 2018-06-03 MED ORDER — HYDROCHLOROTHIAZIDE 12.5 MG PO TABS: 12.5000 mg | ORAL_TABLET | Freq: Every day | ORAL | 0 refills | Status: DC

## 2018-06-24 ENCOUNTER — Encounter: Payer: Self-pay | Admitting: Family Medicine

## 2018-06-24 ENCOUNTER — Other Ambulatory Visit: Payer: Self-pay

## 2018-06-24 ENCOUNTER — Ambulatory Visit: Payer: Self-pay | Admitting: Family Medicine

## 2018-06-24 VITALS — BP 128/82 | HR 68 | Temp 98.6°F | Resp 16 | Ht 67.0 in | Wt 231.2 lb

## 2018-06-24 DIAGNOSIS — Z13220 Encounter for screening for lipoid disorders: Secondary | ICD-10-CM

## 2018-06-24 DIAGNOSIS — R600 Localized edema: Secondary | ICD-10-CM

## 2018-06-24 DIAGNOSIS — I10 Essential (primary) hypertension: Secondary | ICD-10-CM

## 2018-06-24 DIAGNOSIS — J309 Allergic rhinitis, unspecified: Secondary | ICD-10-CM

## 2018-06-24 MED ORDER — LOSARTAN POTASSIUM 100 MG PO TABS: 100.0000 mg | ORAL_TABLET | Freq: Every day | ORAL | 1 refills | Status: DC

## 2018-06-24 MED ORDER — FLUTICASONE PROPIONATE 50 MCG/ACT NA SUSP
2.0000 | Freq: Every day | NASAL | 6 refills | Status: DC
Start: 2018-06-24 — End: 2018-12-04

## 2018-06-24 MED ORDER — HYDROCHLOROTHIAZIDE 12.5 MG PO TABS: 12.5000 mg | ORAL_TABLET | Freq: Every day | ORAL | 1 refills | Status: DC

## 2018-06-24 MED ORDER — FEXOFENADINE HCL 180 MG PO TABS: 180.0000 mg | ORAL_TABLET | Freq: Every day | ORAL | 1 refills | Status: DC

## 2018-06-24 NOTE — Progress Notes (Signed)
Subjective:    Patient ID: Jacob Kelley, male    DOB: [birth year], 53 y.o.   MRN: [id number]  HPI Jacob Kelley is a 53 y.o. male Presents today for: Chief Complaint  Patient presents with  . Hypertension    was last seen here 06/08/16 for HTN was put on hctz which patient stated is not working for him cause he still have swellin in the ankle area  . seasonal allergies    patient would like a refill on allergy meds. Allegra 180 mg   Hypertension: BP Readings from Last 3 Encounters:  06/24/18 128/82  07/26/17 (!) 141/85  05/29/17 132/82   Lab Results  Component Value Date   CREATININE 0.82 04/20/2017  losartan 100mg  QD,hctz 12.5mg  QD.  Some swelling in ankles after a few hours, improves at night with elevation.  No new side effects.  Has cut back on beer now, but had been drinking some more beer last month. Exercising more past few weeks.   Wt Readings from Last 3 Encounters:  06/24/18 231 lb 3.2 oz (104.9 kg)  07/26/17 217 lb 9.6 oz (98.7 kg)  05/29/17 219 lb (99.3 kg)     Allergic rhinitis: Allegra once per day. Doing well on this regimen, but some allergy symptoms return.    UTD on health maintenance      There are no active problems to display for this patient.  Past Medical History:  Diagnosis Date  . Allergy   . Hypertension    History reviewed. No pertinent surgical history. Allergies  Allergen Reactions  . Ibuprofen Swelling and Other (See Comments)    Reaction:  Facial/eye swelling    Prior to Admission medications   Medication Sig Start Date End Date Taking? Authorizing Provider  fexofenadine (ALLEGRA ALLERGY) 180 MG tablet Take 1 tablet (180 mg total) by mouth daily. 07/26/17   Wallis BambergMani, Mario, PA-C  hydrochlorothiazide (HYDRODIURIL) 12.5 MG tablet Take 1 tablet (12.5 mg total) by mouth daily. 06/03/18   Shade FloodGreene, Ivan Maskell R, MD  losartan (COZAAR) 100 MG tablet Take 1 tablet (100 mg total) by mouth daily. 06/03/18   Shade FloodGreene, Rhian Asebedo R, MD   Social  History   Socioeconomic History  . Marital status: Single    Spouse name: Not on file  . Number of children: Not on file  . Years of education: Not on file  . Highest education level: Not on file  Occupational History  . Not on file  Social Needs  . Financial resource strain: Not on file  . Food insecurity:    Worry: Not on file    Inability: Not on file  . Transportation needs:    Medical: Not on file    Non-medical: Not on file  Tobacco Use  . Smoking status: Former Games developermoker  . Smokeless tobacco: Former Engineer, waterUser  Substance and Sexual Activity  . Alcohol use: Yes    Alcohol/week: 7.0 standard drinks    Types: 7 Glasses of wine per week  . Drug use: No  . Sexual activity: Not on file  Lifestyle  . Physical activity:    Days per week: Not on file    Minutes per session: Not on file  . Stress: Not on file  Relationships  . Social connections:    Talks on phone: Not on file    Gets together: Not on file    Attends religious service: Not on file    Active member of club or organization: Not on file  Attends meetings of clubs or organizations: Not on file    Relationship status: Not on file  . Intimate partner violence:    Fear of current or ex partner: Not on file    Emotionally abused: Not on file    Physically abused: Not on file    Forced sexual activity: Not on file  Other Topics Concern  . Not on file  Social History Narrative  . Not on file     Review of Systems  Constitutional: Negative for fatigue and unexpected weight change.  Eyes: Negative for visual disturbance.  Respiratory: Negative for cough, chest tightness and shortness of breath.   Cardiovascular: Negative for chest pain, palpitations and leg swelling.  Gastrointestinal: Negative for abdominal pain and blood in stool.  Neurological: Negative for dizziness, light-headedness and headaches.       Objective:   Physical Exam Vitals signs reviewed.  Constitutional:      Appearance: He is  well-developed.  HENT:     Head: Normocephalic and atraumatic.     Right Ear: Tympanic membrane, ear canal and external ear normal.     Left Ear: Tympanic membrane, ear canal and external ear normal.     Nose: No rhinorrhea.     Mouth/Throat:     Pharynx: No oropharyngeal exudate or posterior oropharyngeal erythema.  Eyes:     Conjunctiva/sclera: Conjunctivae normal.     Pupils: Pupils are equal, round, and reactive to light.  Neck:     Musculoskeletal: Neck supple.     Vascular: No carotid bruit or JVD.  Cardiovascular:     Rate and Rhythm: Normal rate and regular rhythm.     Heart sounds: Normal heart sounds. No murmur.  Pulmonary:     Effort: Pulmonary effort is normal.     Breath sounds: Normal breath sounds. No wheezing, rhonchi or rales.  Abdominal:     Palpations: Abdomen is soft.     Tenderness: There is no abdominal tenderness.  Musculoskeletal:     Comments: Trace pedal edema, non pitting at ankles.   Lymphadenopathy:     Cervical: No cervical adenopathy.  Skin:    General: Skin is warm and dry.     Findings: No rash.  Neurological:     Mental Status: He is alert and oriented to person, place, and time.  Psychiatric:        Behavior: Behavior normal.    Vitals:   06/24/18 1056  BP: 128/82  Pulse: 68  Resp: 16  Temp: 98.6 F (37 C)  TempSrc: Oral  SpO2: 97%  Weight: 231 lb 3.2 oz (104.9 kg)  Height: 5\' 7"  (1.702 m)        Assessment & Plan:    TORI GREWELL is a 53 y.o. male Essential hypertension - Plan: hydrochlorothiazide (HYDRODIURIL) 12.5 MG tablet, losartan (COZAAR) 100 MG tablet, Comprehensive metabolic panel Pedal edema  -  Stable BP, tolerating current regimen. Medications refilled. Labs pending as above.   - min pedal edema. May be weight related. Has adjusted diet, exercise.  Handout given. rtc precautions.   Allergic rhinitis, unspecified seasonality, unspecified trigger - Plan: fexofenadine (ALLEGRA ALLERGY) 180 MG tablet, fluticasone  (FLONASE) 50 MCG/ACT nasal spray  - add flonase, continue allegra.   Screening for hyperlipidemia - Plan: Lipid panel    Meds ordered this encounter  Medications  . fexofenadine (ALLEGRA ALLERGY) 180 MG tablet    Sig: Take 1 tablet (180 mg total) by mouth daily.    Dispense:  90 tablet    Refill:  1  . hydrochlorothiazide (HYDRODIURIL) 12.5 MG tablet    Sig: Take 1 tablet (12.5 mg total) by mouth daily.    Dispense:  90 tablet    Refill:  1  . losartan (COZAAR) 100 MG tablet    Sig: Take 1 tablet (100 mg total) by mouth daily.    Dispense:  90 tablet    Refill:  1  . fluticasone (FLONASE) 50 MCG/ACT nasal spray    Sig: Place 2 sprays into both nostrils daily.    Dispense:  16 g    Refill:  6   Patient Instructions   Blood pressure looks ok today. I expect weight loss will help in a number of ways, including swelling in legs. Try to avoid excess salt in diet. No med changes for now.   For allergies, start flonase 1-2 sprays per nostril daily. Ok to use allegra as well if needed.  Recheck in 6 months.   Return to the clinic or go to the nearest emergency room if any of your symptoms worsen or new symptoms occur.    Edema perifrico (Peripheral Edema) El edema perifrico es la hinchazn causada por la acumulacin de lquido. En la [employer], el edema perifrico afecta la parte inferior de las piernas, los tobillos y los pies. Tambin [employer], las manos y Dance movement psychotherapist. La zona del cuerpo que presenta edema perifrico se ver hinchada. Tambin puede sentirse pesada o caliente. Es posible que sienta que la ropa comienza a apretarle. La presin sobre el rea puede dejar una marca temporal en la piel. Tal vez no pueda mover el brazo o la pierna como lo hace habitualmente. Hay muchas causas posibles de edema perifrico. Puede ser Neomia Dear complicacin de otras enfermedades, como insuficiencia cardaca congestiva, enfermedad renal o un problema con la circulacin  sangunea. Tambin puede ser un efecto secundario de ciertos medicamentos. Es frecuente Academic librarian. A veces, la causa es desconocida. A menudo, la nica solucin para el edema perifrico es el tratamiento de la afeccin preexistente. INSTRUCCIONES PARA EL CUIDADO EN EL HOGAR Est atento a cualquier cambio en los sntomas. Tome estas medidas para Paramedic las molestias:  [employer] las piernas mientras est sentado o recostado.  Muvase con frecuencia para evitar el entumecimiento y reducir la hinchazn. No permanezca sentado o de pie durante largos perodos.  Use medias de compresin como le haya indicado su mdico.  Siga las indicaciones del mdico respecto de las restricciones en el consumo de sal (sodio) en la dieta. A veces, disminuir el consumo de sal puede reducir la hinchazn.  Tome los medicamentos de venta libre y los recetados solamente como se lo haya indicado el mdico. El mdico puede recetarle un medicamento para ayudar a que el cuerpo elimine el exceso de agua (diurtico).  Concurra a todas las visitas de control como se lo haya indicado el mdico. Esto es importante. SOLICITE ATENCIN MDICA SI:  Lance Muss.  El edema aparece de forma repentina o empeora, en especial si usted est embarazada o tiene una enfermedad.  Tiene hinchazn en una sola pierna.  Le aumenta la hinchazn y Chief Technology Officer en las piernas. SOLICITE ATENCIN MDICA DE INMEDIATO SI:  Le falta el aire, especialmente al estar acostado.  Tiene dolor en el pecho o el abdomen.  Se siente dbil.  Se desmaya. Esta informacin no tiene Theme park manager el consejo del mdico. Asegrese de hacerle al mdico cualquier pregunta que  tenga. Document Released: 04/03/2005 Document Revised: 07/26/2015 Document Reviewed: 10/14/2014 Elsevier Interactive Patient Education  [employer].   If you have lab work done today you will be contacted with your lab results within the next 2 weeks.  If you have not  heard from [place] then please contact us. The fastest way to get your results is to register for My Chart.   IF you received an x-ray today, you will receive an invoice from [facility]. Please contact [facility] at [phone] with questions or concerns regarding your invoice.   IF you received labwork today, you will receive an invoice from [facility]. Please contact [facility] at [phone] with questions or concerns regarding your invoice.   Our billing staff will not be able to assist you with questions regarding bills from these companies.  You will be contacted with the lab results as soon as they are available. The fastest way to get your results is to activate your My Chart account. Instructions are located on the last page of this paperwork. If you have not heard from [place] regarding the results in 2 weeks, please contact this office.       Signed,   Meredith Staggers, MD Primary Care at [facility].  06/24/18 11:46 AM

## 2018-06-24 NOTE — Patient Instructions (Addendum)
Blood pressure looks ok today. I expect weight loss will help in a number of ways, including swelling in legs. Try to avoid excess salt in diet. No med changes for now.   For allergies, start flonase 1-2 sprays per nostril daily. Ok to use allegra as well if needed.  Recheck in 6 months.   Return to the clinic or go to the nearest emergency room if any of your symptoms worsen or new symptoms occur.    Edema perifrico (Peripheral Edema) El edema perifrico es la hinchazn causada por la acumulacin de lquido. En la [employer], el edema perifrico afecta la parte inferior de las piernas, los tobillos y los pies. Tambin [employer], las manos y Dance movement psychotherapist. La zona del cuerpo que presenta edema perifrico se ver hinchada. Tambin puede sentirse pesada o caliente. Es posible que sienta que la ropa comienza a apretarle. La presin sobre el rea puede dejar una marca temporal en la piel. Tal vez no pueda mover el brazo o la pierna como lo hace habitualmente. Hay muchas causas posibles de edema perifrico. Puede ser Neomia Dear complicacin de otras enfermedades, como insuficiencia cardaca congestiva, enfermedad renal o un problema con la circulacin sangunea. Tambin puede ser un efecto secundario de ciertos medicamentos. Es frecuente Academic librarian. A veces, la causa es desconocida. A menudo, la nica solucin para el edema perifrico es el tratamiento de la afeccin preexistente. INSTRUCCIONES PARA EL CUIDADO EN EL HOGAR Est atento a cualquier cambio en los sntomas. Tome estas medidas para Paramedic las molestias:  [employer] las piernas mientras est sentado o recostado.  Muvase con frecuencia para evitar el entumecimiento y reducir la hinchazn. No permanezca sentado o de pie durante largos perodos.  Use medias de compresin como le haya indicado su mdico.  Siga las indicaciones del mdico respecto de las restricciones en el consumo de sal (sodio) en la dieta. A veces,  disminuir el consumo de sal puede reducir la hinchazn.  Tome los medicamentos de venta libre y los recetados solamente como se lo haya indicado el mdico. El mdico puede recetarle un medicamento para ayudar a que el cuerpo elimine el exceso de agua (diurtico).  Concurra a todas las visitas de control como se lo haya indicado el mdico. Esto es importante. SOLICITE ATENCIN MDICA SI:  Lance Muss.  El edema aparece de forma repentina o empeora, en especial si usted est embarazada o tiene una enfermedad.  Tiene hinchazn en una sola pierna.  Le aumenta la hinchazn y Chief Technology Officer en las piernas. SOLICITE ATENCIN MDICA DE INMEDIATO SI:  Le falta el aire, especialmente al estar acostado.  Tiene dolor en el pecho o el abdomen.  Se siente dbil.  Se desmaya. Esta informacin no tiene Theme park manager el consejo del mdico. Asegrese de hacerle al mdico cualquier pregunta que tenga. Document Released: 04/03/2005 Document Revised: 07/26/2015 Document Reviewed: 10/14/2014 Elsevier Interactive Patient Education  [employer].   If you have lab work done today you will be contacted with your lab results within the next 2 weeks.  If you have not heard from [place] then please contact us. The fastest way to get your results is to register for My Chart.   IF you received an x-ray today, you will receive an invoice from [facility]. Please contact [facility]iology at [phone] with questions or concerns regarding your invoice.   IF you received labwork today, you will receive an invoice from [employer]. Please contact [facility] at  [phone] with questions or concerns regarding your invoice.   Our billing staff will not be able to assist you with questions regarding bills from these companies.  You will be contacted with the lab results as soon as they are available. The fastest way to get your results is to activate your My Chart account. Instructions are located on the  last page of this paperwork. If you have not heard from [place] regarding the results in 2 weeks, please contact this office.

## 2018-06-25 LAB — COMPREHENSIVE METABOLIC PANEL
A/G RATIO: 1.9 (ref 1.2–2.2)
ALBUMIN: 4.5 g/dL (ref 3.8–4.9)
ALT: 37 IU/L (ref 0–44)
AST: 20 IU/L (ref 0–40)
Alkaline Phosphatase: 88 IU/L (ref 39–117)
BUN / CREAT RATIO: 21 — AB (ref 9–20)
BUN: 18 mg/dL (ref 6–24)
Bilirubin Total: 0.4 mg/dL (ref 0.0–1.2)
CALCIUM: 9.8 mg/dL (ref 8.7–10.2)
CO2: 22 mmol/L (ref 20–29)
CREATININE: 0.87 mg/dL (ref 0.76–1.27)
Chloride: 101 mmol/L (ref 96–106)
GFR calc Af Amer: 116 mL/min/{1.73_m2} (ref >59)
GFR, EST NON AFRICAN AMERICAN: 100 mL/min/{1.73_m2} (ref >59)
GLOBULIN, TOTAL: 2.4 g/dL (ref 1.5–4.5)
Glucose: 97 mg/dL (ref 65–99)
POTASSIUM: 4.5 mmol/L (ref 3.5–5.2)
SODIUM: 139 mmol/L (ref 134–144)
Total Protein: 6.9 g/dL (ref 6.0–8.5)

## 2018-06-25 LAB — LIPID PANEL
CHOLESTEROL TOTAL: 216 mg/dL — AB (ref 100–199)
Chol/HDL Ratio: 4.9 ratio (ref 0.0–5.0)
HDL: 44 mg/dL (ref >39)
LDL Calculated: 151 mg/dL — ABNORMAL HIGH (ref 0–99)
TRIGLYCERIDES: 104 mg/dL (ref 0–149)
VLDL CHOLESTEROL CAL: 21 mg/dL (ref 5–40)

## 2018-12-04 ENCOUNTER — Other Ambulatory Visit: Payer: Self-pay | Admitting: Family Medicine

## 2018-12-04 DIAGNOSIS — J309 Allergic rhinitis, unspecified: Secondary | ICD-10-CM

## 2018-12-16 ENCOUNTER — Other Ambulatory Visit: Payer: Self-pay | Admitting: Family Medicine

## 2018-12-16 DIAGNOSIS — I10 Essential (primary) hypertension: Secondary | ICD-10-CM

## 2018-12-16 NOTE — Telephone Encounter (Signed)
Requested medication (s) are due for refill today:yes  Requested medication (s) are on the active medication list:yes  Last refill: 09/30/2018 Future visit scheduled: no  Notes to clinic:  Review for refill  Requested Prescriptions  Pending Prescriptions Disp Refills   losartan (COZAAR) 100 MG tablet [Pharmacy Med Name: [place] 100MG  TAB] 90 tablet 0    Sig: TAKE ONE TABLET BY MOUTH DAILY     Cardiovascular:  Angiotensin Receptor Blockers Passed - 12/16/2018  3:01 PM      Passed - Cr in normal range and within 180 days    Creat  Date Value Ref Range Status  11/22/2015 0.71 0.60 - 1.35 mg/dL Final   Creatinine, Ser  Date Value Ref Range Status  06/24/2018 0.87 0.76 - 1.27 mg/dL Final         Passed - K in normal range and within 180 days    Potassium  Date Value Ref Range Status  06/24/2018 4.5 3.5 - 5.2 mmol/L Final         Passed - Patient is not pregnant      Passed - Last BP in normal range    BP Readings from Last 1 Encounters:  06/24/18 128/82         Passed - Valid encounter within last 6 months    Recent Outpatient Visits          5 months ago Essential hypertension   [facility] at [facility], Ranell Patrick, MD   1 year ago Abdominal pain, epigastric   Primary Care at [place], PA-C   1 year ago Abdominal pain, epigastric   Primary Care at [facility], [place], [place]   1 year ago Essential hypertension   Primary Care at [facility], [place], [place]   2 years ago Essential hypertension   [facility] at [facility], Ranell Patrick, MD              hydrochlorothiazide (HYDRODIURIL) 12.5 MG tablet [Pharmacy Med Name: HYDROCHLOROTHIAZIDE 12.5MG  TAB] 90 tablet 0    Sig: TAKE ONE TABLET BY MOUTH DAILY     Cardiovascular: Diuretics - Thiazide Passed - 12/16/2018  3:01 PM      Passed - Ca in normal range and within 360 days    Calcium  Date Value Ref Range Status  06/24/2018 9.8 8.7 - 10.2 mg/dL Final         Passed - Cr in normal range and  within 360 days    Creat  Date Value Ref Range Status  11/22/2015 0.71 0.60 - 1.35 mg/dL Final   Creatinine, Ser  Date Value Ref Range Status  06/24/2018 0.87 0.76 - 1.27 mg/dL Final         Passed - K in normal range and within 360 days    Potassium  Date Value Ref Range Status  06/24/2018 4.5 3.5 - 5.2 mmol/L Final         Passed - Na in normal range and within 360 days    Sodium  Date Value Ref Range Status  06/24/2018 139 134 - 144 mmol/L Final         Passed - Last BP in normal range    BP Readings from Last 1 Encounters:  06/24/18 128/82         Passed - Valid encounter within last 6 months    Recent Outpatient Visits          5 months ago Essential hypertension   [facility], Asencion PartridgeJeffrey R, MD   1 year ago Abdominal pain, epigastric   [facility], [place]   1 year ago Abdominal pain, epigastric   [facility], [place]   1 year ago Essential hypertension   [facility] [place]   2 years ago Essential hypertension   [facility], Asencion PartridgeJeffrey R, MD

## 2018-12-18 NOTE — Telephone Encounter (Signed)
Pt needs ov toc with sagardia.  Will refill bp meds losartan 100 mg #90 and hctz 12.5 mg #90 with 0 refills but no more w/o appt with sagardia for TOC visit. Sent to scheduling pool to call pt and setup appt. Dgaddy, CMA

## 2018-12-25 NOTE — Telephone Encounter (Signed)
Pt states he did not request medication, and will call back to schedule an appointment when he needs refills

## 2019-01-03 ENCOUNTER — Other Ambulatory Visit: Payer: Self-pay | Admitting: Emergency Medicine

## 2019-01-03 DIAGNOSIS — I10 Essential (primary) hypertension: Secondary | ICD-10-CM

## 2019-01-03 NOTE — Telephone Encounter (Signed)
Medication Refill - Medication: losartan (COZAAR) 100 MG tablet  Has the patient contacted their pharmacy? Yes - states he needs to call doctor (Agent: If no, request that the patient contact the pharmacy for the refill.) (Agent: If yes, when and what did the pharmacy advise?)  Preferred Pharmacy (with phone number or street name):  [facility], [address] [phone] (Phone) [phone] (Fax)   Agent: Please be advised that RX refills may take up to 3 business days. We ask that you follow-up with your pharmacy.

## 2019-04-08 ENCOUNTER — Other Ambulatory Visit: Payer: Self-pay | Admitting: Family Medicine

## 2019-04-08 DIAGNOSIS — I10 Essential (primary) hypertension: Secondary | ICD-10-CM

## 2019-04-08 NOTE — Telephone Encounter (Signed)
Requested Prescriptions  Pending Prescriptions Disp Refills  . hydrochlorothiazide (HYDRODIURIL) 12.5 MG tablet [Pharmacy Med Name: HYDROCHLOROTHIAZIDE 12.5MG  TAB] 30 tablet 0    Sig: TAKE ONE TABLET BY MOUTH DAILY     Cardiovascular: Diuretics - Thiazide Failed - 04/08/2019 10:20 AM      Failed - Valid encounter within last 6 months    Recent Outpatient Visits          9 months ago Essential hypertension   [facility] at [facility], Asencion Partridge, MD   1 year ago Abdominal pain, epigastric   Primary Care at [facility], [place], [place]   1 year ago Abdominal pain, epigastric   Primary Care at [facility], [place], [place]   1 year ago Essential hypertension   Primary Care at [facility], [place], [place]   2 years ago Essential hypertension   [facility] at [facility], Asencion Partridge, MD             Passed - Ca in normal range and within 360 days    Calcium  Date Value Ref Range Status  06/24/2018 9.8 8.7 - 10.2 mg/dL Final         Passed - Cr in normal range and within 360 days    Creat  Date Value Ref Range Status  11/22/2015 0.71 0.60 - 1.35 mg/dL Final   Creatinine, Ser  Date Value Ref Range Status  06/24/2018 0.87 0.76 - 1.27 mg/dL Final         Passed - K in normal range and within 360 days    Potassium  Date Value Ref Range Status  06/24/2018 4.5 3.5 - 5.2 mmol/L Final         Passed - Na in normal range and within 360 days    Sodium  Date Value Ref Range Status  06/24/2018 139 134 - 144 mmol/L Final         Passed - Last BP in normal range    BP Readings from Last 1 Encounters:  06/24/18 128/82         . losartan (COZAAR) 100 MG tablet [Pharmacy Med Name: LOSARTAN POTASSIUM 100MG  TAB] 90 tablet 0    Sig: TAKE ONE TABLET BY MOUTH DAILY     Cardiovascular:  Angiotensin Receptor Blockers Failed - 04/08/2019 10:20 AM      Failed - Cr in normal range and within 180 days    Creat  Date Value Ref Range Status  11/22/2015 0.71 0.60 - 1.35 mg/dL Final    Creatinine, Ser  Date Value Ref Range Status  06/24/2018 0.87 0.76 - 1.27 mg/dL Final         Failed - K in normal range and within 180 days    Potassium  Date Value Ref Range Status  06/24/2018 4.5 3.5 - 5.2 mmol/L Final         Failed - Valid encounter within last 6 months    Recent Outpatient Visits          9 months ago Essential hypertension   Primary Care at 08/24/2018, [facility], MD   1 year ago Abdominal pain, epigastric   Primary Care at [facility], [place], [place]   1 year ago Abdominal pain, epigastric   Primary Care at [facility], [place], [place]   1 year ago Essential hypertension   Primary Care at [facility], [place], [place]   2 years ago Essential hypertension   Primary Care at [place], [place], MD  Passed - Patient is not pregnant      Passed - Last BP in normal range    BP Readings from Last 1 Encounters:  06/24/18 128/82

## 2019-06-16 ENCOUNTER — Encounter: Payer: Self-pay | Admitting: Emergency Medicine

## 2019-06-16 ENCOUNTER — Ambulatory Visit (INDEPENDENT_AMBULATORY_CARE_PROVIDER_SITE_OTHER): Payer: Self-pay | Admitting: Emergency Medicine

## 2019-06-16 ENCOUNTER — Other Ambulatory Visit: Payer: Self-pay

## 2019-06-16 VITALS — BP 133/88 | HR 68 | Temp 98.0°F | Resp 16 | Ht 67.0 in | Wt 227.0 lb

## 2019-06-16 DIAGNOSIS — I10 Essential (primary) hypertension: Secondary | ICD-10-CM

## 2019-06-16 DIAGNOSIS — J309 Allergic rhinitis, unspecified: Secondary | ICD-10-CM

## 2019-06-16 MED ORDER — FEXOFENADINE HCL 180 MG PO TABS: 180.0000 mg | ORAL_TABLET | Freq: Every day | ORAL | 1 refills | Status: DC

## 2019-06-16 MED ORDER — LOSARTAN POTASSIUM-HCTZ 100-12.5 MG PO TABS: 1.0000 | ORAL_TABLET | Freq: Every day | ORAL | 3 refills | Status: DC

## 2019-06-16 MED ORDER — FLUTICASONE PROPIONATE 50 MCG/ACT NA SUSP: 2.0000 | Freq: Every day | NASAL | 5 refills | Status: DC

## 2019-06-16 NOTE — Progress Notes (Signed)
Jacob Kelley 54 y.o.   Chief Complaint  Patient presents with  . Medication Refill    PEND    HISTORY OF PRESENT ILLNESS: This is a 54 y.o. male with history of hypertension and seasonal allergic rhinitis here for follow-up and medication refill. Doing well.  Has no complaints. Had Covid infection 2 months ago, recovered well.  Asymptomatic.  HPI   Prior to Admission medications   Medication Sig Start Date End Date Taking? Authorizing Provider  fexofenadine (ALLEGRA ALLERGY) 180 MG tablet Take 1 tablet (180 mg total) by mouth daily. 06/16/19  Yes Jacob Kelley, Jacob Kempf, MD  fluticasone [facility]) 50 MCG/ACT nasal spray Place 2 sprays into both nostrils daily. 06/16/19  Yes Jacob Kelley, Jacob Kempf, MD  hydrochlorothiazide (HYDRODIURIL) 12.5 MG tablet TAKE ONE TABLET BY MOUTH DAILY 04/08/19  Yes Jacob Kelley, Jacob Kempf, MD  losartan (COZAAR) 100 MG tablet TAKE ONE TABLET BY MOUTH DAILY 04/08/19  Yes Jacob Kelley, Jacob Kempf, MD  losartan-hydrochlorothiazide (HYZAAR) 100-12.5 MG tablet Take 1 tablet by mouth daily. 06/16/19   Jacob Quint, MD    Allergies  Allergen Reactions  . Ibuprofen Swelling and Other (See Comments)    Reaction:  Facial/eye swelling     There are no problems to display for this patient.   Past Medical History:  Diagnosis Date  . Allergy   . Hypertension     History reviewed. No pertinent surgical history.  Social History   Socioeconomic History  . Marital status: Single    Spouse name: Not on file  . Number of children: Not on file  . Years of education: Not on file  . Highest education level: Not on file  Occupational History  . Not on file  Tobacco Use  . Smoking status: Former Games developer  . Smokeless tobacco: Former Engineer, water and Sexual Activity  . Alcohol use: Yes    Alcohol/week: 7.0 standard drinks    Types: 7 Glasses of wine per week  . Drug use: No  . Sexual activity: Not on file  Other Topics Concern  . Not on file  Social  History Narrative  . Not on file   Social Determinants of Health   Financial Resource Strain:   . Difficulty of Paying Living Expenses: Not on file  Food Insecurity:   . Worried About Programme researcher, broadcasting/film/video in the Last Year: Not on file  . Ran Out of Food in the Last Year: Not on file  Transportation Needs:   . Lack of Transportation (Medical): Not on file  . Lack of Transportation (Non-Medical): Not on file  Physical Activity:   . Days of Exercise per Week: Not on file  . Minutes of Exercise per Session: Not on file  Stress:   . Feeling of Stress : Not on file  Social Connections:   . Frequency of Communication with Friends and Family: Not on file  . Frequency of Social Gatherings with Friends and Family: Not on file  . Attends Religious Services: Not on file  . Active Member of Clubs or Organizations: Not on file  . Attends Banker Meetings: Not on file  . Marital Status: Not on file  Intimate Partner Violence:   . Fear of Current or Ex-Partner: Not on file  . Emotionally Abused: Not on file  . Physically Abused: Not on file  . Sexually Abused: Not on file    Family History  Problem Relation Age of Onset  . Stroke Mother   .  Heart disease Father   . Hypertension Brother      Review of Systems  Constitutional: Negative.  Negative for fever and malaise/fatigue.  HENT: Negative.  Negative for congestion and sore throat.   Eyes: Negative.   Respiratory: Negative.  Negative for cough and shortness of breath.   Cardiovascular: Negative.  Negative for chest pain and palpitations.  Gastrointestinal: Negative.  Negative for abdominal pain, blood in stool, diarrhea, nausea and vomiting.  Genitourinary: Negative.   Musculoskeletal: Negative.  Negative for myalgias.  Skin: Negative.  Negative for rash.  Neurological: Negative.  Negative for dizziness and headaches.  Endo/Heme/Allergies: Positive for environmental allergies.  All other systems reviewed and are  negative.  Vitals:   06/16/19 0805  BP: 133/88  Pulse: 68  Resp: 16  Temp: 98 F (36.7 C)  SpO2: 96%     Physical Exam Vitals reviewed.  Constitutional:      Appearance: Normal appearance.  HENT:     Head: Normocephalic.  Eyes:     Extraocular Movements: Extraocular movements intact.     Pupils: Pupils are equal, round, and reactive to light.  Cardiovascular:     Rate and Rhythm: Normal rate.  Pulmonary:     Effort: Pulmonary effort is normal.  Musculoskeletal:        General: Normal range of motion.     Cervical back: Normal range of motion.  Skin:    General: Skin is warm and dry.     Capillary Refill: Capillary refill takes less than 2 seconds.  Neurological:     General: No focal deficit present.     Mental Status: He is alert and oriented to person, place, and time.  Psychiatric:        Mood and Affect: Mood normal.        Behavior: Behavior normal.      ASSESSMENT & PLAN: Clinically stable.  No medical concerns identified during this visit.  Continue present medications.  No changes.  Jacob Kelley was seen today for medication refill.  Diagnoses and all orders for this visit:  Essential hypertension -     losartan-hydrochlorothiazide (HYZAAR) 100-12.5 MG tablet; Take 1 tablet by mouth daily.  Allergic rhinitis, unspecified seasonality, unspecified trigger -     fexofenadine (ALLEGRA ALLERGY) 180 MG tablet; Take 1 tablet (180 mg total) by mouth daily. -     fluticasone (FLONASE) 50 MCG/ACT nasal spray; Place 2 sprays into both nostrils daily.    Patient Instructions       If you have lab work done today you will be contacted with your lab results within the next 2 weeks.  If you have not heard from [place] then please contact us. The fastest way to get your results is to register for My Chart.   IF you received an x-ray today, you will receive an invoice from [facility]. Please contact [facility] at [phone] with questions or  concerns regarding your invoice.   IF you received labwork today, you will receive an invoice from [facility]. Please contact [facility] at [phone] with questions or concerns regarding your invoice.   Our billing staff will not be able to assist you with questions regarding bills from these companies.  You will be contacted with the lab results as soon as they are available. The fastest way to get your results is to activate your My Chart account. Instructions are located on the last page of this paperwork. If you have not heard from [place] regarding the results in  2 weeks, please contact this office.     Hipertensin en los adultos Hypertension, Adult El trmino hipertensin es otra forma de denominar a la presin arterial elevada. La presin arterial elevada fuerza al corazn a trabajar ms para bombear la sangre. Esto puede causar problemas con el paso del [place]. Una lectura de presin arterial est compuesta por 2 nmeros. Hay un nmero superior (sistlico) sobre un nmero inferior (diastlico). Lo ideal es tener la presin arterial por debajo de 120/80. Las elecciones saludables pueden ayudar a Engineer, materials presin arterial, o tal vez necesite medicamentos para bajarla. Cules son las causas? Se desconoce la causa de esta afeccin. Algunas afecciones pueden estar relacionadas con la presin arterial alta. Qu incrementa el riesgo?  Fumar.  Tener diabetes mellitus tipo 2, colesterol alto, o ambos.  No hacer la cantidad suficiente de actividad fsica o ejercicio.  Tener sobrepeso.  Consumir mucha grasa, azcar, caloras o sal (sodio) en su dieta.  Beber alcohol en exceso.  Tener una enfermedad renal a largo plazo (crnica).  Tener antecedentes familiares de presin arterial alta.  Edad. Los riesgos aumentan con la edad.  Raza. El riesgo es mayor para las Retail banker.  Sexo. Antes de los 45aos, los hombres corren ms [employer]. Despus de los 65aos,  las mujeres corren ms [employer].  Tener apnea obstructiva del sueo.  Estrs. Cules son los signos o los sntomas?  Es posible que la presin arterial alta puede no cause sntomas. La presin arterial muy alta (crisis hipertensiva) puede provocar: ? Dolor de [place]. ? Sensaciones de preocupacin o nerviosismo (ansiedad). ? Falta de aire. ? Hemorragia nasal. ? Sensacin de Engineer, site (nuseas). ? Vmitos. ? Cambios en la forma de ver. ? Dolor muy intenso en el pecho. ? Convulsiones. Cmo se trata?  Esta afeccin se trata haciendo cambios saludables en el estilo de vida, por ejemplo: ? Consumir alimentos saludables. ? Hacer ms ejercicio. ? Beber menos alcohol.  El mdico puede recetarle medicamentos si los cambios en el estilo de vida no son suficientes para Child psychotherapist la presin arterial y si: ? El nmero de arriba est por encima de 130. ? El nmero de abajo est por encima de 80.  Su presin arterial personal ideal puede variar. Siga estas instrucciones en su casa: Comida y bebida   Si se lo dicen, siga el plan de alimentacin de DASH (Dietary Approaches to Stop Hypertension, Maneras de alimentarse para detener la hipertensin). Para seguir este plan: ? Llene la mitad del plato de cada comida con frutas y verduras. ? Llene un cuarto del plato de cada comida con cereales integrales. Los cereales integrales incluyen pasta integral, arroz integral y pan integral. ? Coma y beba productos lcteos con bajo contenido de grasa, como leche descremada o yogur bajo en grasas. ? Llene un cuarto del plato de cada comida con protenas bajas en grasa (magras). Las protenas bajas en grasa incluyen pescado, pollo sin piel, huevos, frijoles y tofu. ? Evite consumir carne grasa, carne curada y procesada, o pollo con piel. ? Evite consumir alimentos prehechos o procesados.  Consuma menos de 1500 mg de sal por da.  No beba alcohol si: ? El mdico le  indica que no lo haga. ? Est embarazada, puede estar embarazada o est tratando de quedar embarazada.  Si bebe alcohol: ? Limite la cantidad que bebe a lo siguiente:  De 0 a 1 medida por da para las mujeres.  De 0 a  2 medidas por da para los hombres. ? Est atento a la cantidad de alcohol que hay en las bebidas que toma. En los [place], una medida equivale a una botella de cerveza de 12oz ( ), un vaso de vino de 5oz ( ) o un vaso de una bebida alcohlica de alta graduacin de 1oz (57ml). Estilo de vida   Trabaje con su mdico para mantenerse en un peso saludable o para perder peso. Pregntele a su mdico cul es el peso recomendable para usted.  Haga al menos de ejercicio la [employer] de la [place]. Estos pueden incluir caminar, nadar o andar en bicicleta.  Realice al menos 30 minutos de ejercicio que fortalezca sus msculos (ejercicios de resistencia) al menos 3 das a la [place]. Estos pueden incluir levantar pesas o hacer Pilates.  No consuma ningn producto que contenga nicotina o tabaco, como cigarrillos, cigarrillos electrnicos y tabaco de Theatre manager. Si necesita ayuda para dejar de fumar, consulte al [employer].  Controle su presin arterial en su casa tal como le indic el mdico.  Concurra a todas las visitas de seguimiento como se lo haya indicado el mdico. Esto es importante. Medicamentos  [employer] de venta libre y los recetados solamente como se lo haya indicado el mdico. Siga cuidadosamente las indicaciones.  No omita las dosis de medicamentos para la presin arterial. Los medicamentos pierden eficacia si omite dosis. El hecho de omitir las dosis tambin [place] el riesgo de otros problemas.  Pregntele a su mdico a qu efectos secundarios o reacciones a los Museum/gallery curator. Comunquese con un mdico si:  Piensa que tiene [place] reaccin a los medicamentos que est tomando.  Tiene dolores de cabeza  frecuentes (recurrentes).  Se siente mareado.  Tiene hinchazn en los tobillos.  Tiene problemas de visin. Solicite ayuda inmediatamente si:  Siente un dolor de cabeza muy intenso.  Empieza a sentirse desorientado (confundido).  Se siente dbil o adormecido.  Siente que va a desmayarse.  Tiene un dolor muy intenso en las siguientes zonas: ? Pecho. ? Vientre (abdomen).  Vomita ms de una vez.  Tiene dificultad para respirar. Resumen  El trmino hipertensin es otra forma de denominar a la presin arterial elevada.  La presin arterial elevada fuerza al corazn a trabajar ms para bombear la sangre.  Para la [employer], una presin arterial normal es menor que 120/80.  Las decisiones saludables pueden ayudarle a disminuir su presin arterial. Si no puede bajar su presin arterial mediante decisiones saludables, es posible que deba tomar medicamentos. Esta informacin no tiene Theme park manager el consejo del mdico. Asegrese de hacerle al mdico cualquier pregunta que tenga. Document Revised: 01/17/2018 Document Reviewed: 01/17/2018 Elsevier Patient Education  2020 Elsevier Inc.      Edwina Barth, MD [facility]

## 2019-06-16 NOTE — Patient Instructions (Addendum)
   If you have lab work done today you will be contacted with your lab results within the next 2 weeks.  If you have not heard from us then please contact us. The fastest way to get your results is to register for My Chart.   IF you received an x-ray today, you will receive an invoice from [facility]. Please contact [facility] at [phone] with questions or concerns regarding your invoice.   IF you received labwork today, you will receive an invoice from [facility]. Please contact [facility] at [phone] with questions or concerns regarding your invoice.   Our billing staff will not be able to assist you with questions regarding bills from these companies.  You will be contacted with the lab results as soon as they are available. The fastest way to get your results is to activate your My Chart account. Instructions are located on the last page of this paperwork. If you have not heard from us regarding the results in 2 weeks, please contact this office.      Hipertensin en los adultos Hypertension, Adult El trmino hipertensin es otra forma de denominar a la presin arterial elevada. La presin arterial elevada fuerza al corazn a trabajar ms para bombear la sangre. Esto puede causar problemas con el paso del tiempo. Una lectura de presin arterial est compuesta por 2 nmeros. Hay un nmero superior (sistlico) sobre un nmero inferior (diastlico). Lo ideal es tener la presin arterial por debajo de 120/80. Las elecciones saludables pueden ayudar a bajar la presin arterial, o tal vez necesite medicamentos para bajarla. Cules son las causas? Se desconoce la causa de esta afeccin. Algunas afecciones pueden estar relacionadas con la presin arterial alta. Qu incrementa el riesgo?  Fumar.  Tener diabetes mellitus tipo 2, colesterol alto, o ambos.  No hacer la cantidad suficiente de actividad fsica o ejercicio.  Tener sobrepeso.  Consumir mucha grasa,  azcar, caloras o sal (sodio) en su dieta.  Beber alcohol en exceso.  Tener una enfermedad renal a largo plazo (crnica).  Tener antecedentes familiares de presin arterial alta.  Edad. Los riesgos aumentan con la edad.  Raza. El riesgo es mayor para las personas afroamericanas.  Sexo. Antes de los 45aos, los hombres corren ms riesgo que las mujeres. Despus de los 65aos, las mujeres corren ms riesgo que los hombres.  Tener apnea obstructiva del sueo.  Estrs. Cules son los signos o los sntomas?  Es posible que la presin arterial alta puede no cause sntomas. La presin arterial muy alta (crisis hipertensiva) puede provocar: ? Dolor de cabeza. ? Sensaciones de preocupacin o nerviosismo (ansiedad). ? Falta de aire. ? Hemorragia nasal. ? Sensacin de malestar en el estmago (nuseas). ? Vmitos. ? Cambios en la forma de ver. ? Dolor muy intenso en el pecho. ? Convulsiones. Cmo se trata?  Esta afeccin se trata haciendo cambios saludables en el estilo de vida, por ejemplo: ? Consumir alimentos saludables. ? Hacer ms ejercicio. ? Beber menos alcohol.  El mdico puede recetarle medicamentos si los cambios en el estilo de vida no son suficientes para lograr controlar la presin arterial y si: ? El nmero de arriba est por encima de 130. ? El nmero de abajo est por encima de 80.  Su presin arterial personal ideal puede variar. Siga estas instrucciones en su casa: Comida y bebida   Si se lo dicen, siga el plan de alimentacin de DASH (Dietary Approaches to Stop Hypertension, Maneras de alimentarse para detener la hipertensin).   Para seguir este plan: ? Llene la mitad del plato de cada comida con frutas y verduras. ? Llene un cuarto del plato de cada comida con cereales integrales. Los cereales integrales incluyen pasta integral, arroz integral y pan integral. ? Coma y beba productos lcteos con bajo contenido de grasa, como leche descremada o yogur bajo en  grasas. ? Llene un cuarto del plato de cada comida con protenas bajas en grasa (magras). Las protenas bajas en grasa incluyen pescado, pollo sin piel, huevos, frijoles y tofu. ? Evite consumir carne grasa, carne curada y procesada, o pollo con piel. ? Evite consumir alimentos prehechos o procesados.  Consuma menos de 1500 mg de sal por da.  No beba alcohol si: ? El mdico le indica que no lo haga. ? Est embarazada, puede estar embarazada o est tratando de quedar embarazada.  Si bebe alcohol: ? Limite la cantidad que bebe a lo siguiente:  De 0 a 1 medida por da para las mujeres.  De 0 a 2 medidas por da para los hombres. ? Est atento a la cantidad de alcohol que hay en las bebidas que toma. En los [place], una medida equivale a una botella de cerveza de 12oz (355ml), un vaso de vino de 5oz (148ml) o un vaso de una bebida alcohlica de alta graduacin de 1oz (44ml). Estilo de vida   Trabaje con su mdico para mantenerse en un peso saludable o para perder peso. Pregntele a su mdico cul es el peso recomendable para usted.  Haga al menos 30minutos de ejercicio la mayora de los das de la semana. Estos pueden incluir caminar, nadar o andar en bicicleta.  Realice al menos 30 minutos de ejercicio que fortalezca sus msculos (ejercicios de resistencia) al menos 3 das a la semana. Estos pueden incluir levantar pesas o hacer Pilates.  No consuma ningn producto que contenga nicotina o tabaco, como cigarrillos, cigarrillos electrnicos y tabaco de mascar. Si necesita ayuda para dejar de fumar, consulte al mdico.  Controle su presin arterial en su casa tal como le indic el mdico.  Concurra a todas las visitas de seguimiento como se lo haya indicado el mdico. Esto es importante. Medicamentos  T[place] los medicamentos de venta libre y los recetados solamente como se lo haya indicado el mdico. Siga cuidadosamente las indicaciones.  No omita las dosis de medicamentos  para la presin arterial. Los medicamentos pierden eficacia si omite dosis. El hecho de omitir las dosis tambin aumenta el riesgo de otros problemas.  Pregntele a su mdico a qu efectos secundarios o reacciones a los medicamentos debe prestar atencin. Comunquese con un mdico si:  Piensa que tiene una reaccin a los medicamentos que est tomando.  Tiene dolores de cabeza frecuentes (recurrentes).  Se siente mareado.  Tiene hinchazn en los tobillos.  Tiene problemas de visin. Solicite ayuda inmediatamente si:  Siente un dolor de cabeza muy intenso.  Empieza a sentirse desorientado (confundido).  Se siente dbil o adormecido.  Siente que va a desmayarse.  Tiene un dolor muy intenso en las siguientes zonas: ? Pecho. ? Vientre (abdomen).  Vomita ms de una vez.  Tiene dificultad para respirar. Resumen  El trmino hipertensin es otra forma de denominar a la presin arterial elevada.  La presin arterial elevada fuerza al corazn a trabajar ms para bombear la sangre.  Para la mayora de las personas, una presin arterial normal es menor que 120/80.  Las decisiones saludables pueden ayudarle a disminuir su presin arterial. Si no puede   bajar su presin arterial mediante decisiones saludables, es posible que deba tomar medicamentos. Esta informacin no tiene como fin reemplazar el consejo del mdico. Asegrese de hacerle al mdico cualquier pregunta que tenga. Document Revised: 01/17/2018 Document Reviewed: 01/17/2018 Elsevier Patient Education  2020 Elsevier Inc.  

## 2019-10-10 ENCOUNTER — Encounter: Payer: Self-pay | Admitting: Emergency Medicine

## 2019-10-10 ENCOUNTER — Emergency Department: Payer: Self-pay

## 2019-10-10 ENCOUNTER — Emergency Department
Admission: EM | Admit: 2019-10-10 | Discharge: 2019-10-10 | Disposition: A | Discharge status: Home / Self Care | Payer: Self-pay | Attending: Emergency Medicine | Admitting: Emergency Medicine

## 2019-10-10 DIAGNOSIS — Z87891 Personal history of nicotine dependence: Secondary | ICD-10-CM | POA: Insufficient documentation

## 2019-10-10 DIAGNOSIS — Z79899 Other long term (current) drug therapy: Secondary | ICD-10-CM | POA: Insufficient documentation

## 2019-10-10 DIAGNOSIS — R079 Chest pain, unspecified: Secondary | ICD-10-CM | POA: Insufficient documentation

## 2019-10-10 DIAGNOSIS — I10 Essential (primary) hypertension: Secondary | ICD-10-CM | POA: Insufficient documentation

## 2019-10-10 LAB — CBC
HCT: 44.7 % (ref 39.0–52.0)
Hemoglobin: 15.8 g/dL (ref 13.0–17.0)
MCH: 30.7 pg (ref 26.0–34.0)
MCHC: 35.3 g/dL (ref 30.0–36.0)
MCV: 86.8 fL (ref 80.0–100.0)
Platelets: 300 10*3/uL (ref 150–400)
RBC: 5.15 MIL/uL (ref 4.22–5.81)
RDW: 12.9 % (ref 11.5–15.5)
WBC: 8.1 10*3/uL (ref 4.0–10.5)
nRBC: 0 % (ref 0.0–0.2)

## 2019-10-10 LAB — BASIC METABOLIC PANEL
Anion gap: 10 (ref 5–15)
BUN: 15 mg/dL (ref 6–20)
CO2: 27 mmol/L (ref 22–32)
Calcium: 9.2 mg/dL (ref 8.9–10.3)
Chloride: 99 mmol/L (ref 98–111)
Creatinine, Ser: 0.95 mg/dL (ref 0.61–1.24)
GFR calc Af Amer: >60 mL/min (ref >60)
GFR calc non Af Amer: >60 mL/min (ref >60)
Glucose, Bld: 107 mg/dL — ABNORMAL HIGH (ref 70–99)
Potassium: 3.9 mmol/L (ref 3.5–5.1)
Sodium: 136 mmol/L (ref 135–145)

## 2019-10-10 LAB — TROPONIN I (HIGH SENSITIVITY)
Troponin I (High Sensitivity): 4 ng/L (ref <18)
Troponin I (High Sensitivity): 5 ng/L (ref <18)

## 2019-10-10 MED ORDER — OMEPRAZOLE 20 MG PO CPDR: 20.0000 mg | DELAYED_RELEASE_CAPSULE | Freq: Every day | ORAL | 1 refills | Status: DC

## 2019-10-10 MED ORDER — SODIUM CHLORIDE 0.9% FLUSH: 3.0000 mL | Freq: Once | INTRAVENOUS | Status: DC

## 2019-10-10 NOTE — ED Provider Notes (Signed)
[facility] Emergency Department Provider Note   ____________________________________________   First MD Initiated Contact with Patient 10/10/19 1823     (approximate)  I have reviewed the triage vital signs and the nursing notes.   HISTORY  Chief Complaint Chest Pain    HPI Jacob Kelley is a 54 y.o. male with past medical history of hypertension who presents to the ED complaining of chest pain.  Patient reports that approximately 1 hour ago he had sudden onset of tingling in his left arm associated with discomfort and fullness in his epigastrium.  This occurred a little while after he ate and resolved over the course of about an hour.  He now states he feels back to normal and has not had any fevers, cough, shortness of breath, leg swelling or pain.  He has had similar episodes of epigastric discomfort in the past after meals, but states he was more concerned today when he had the tingling in his left arm.  He denies any significant cardiac history.        Past Medical History:  Diagnosis Date  . Allergy   . Hypertension     There are no problems to display for this patient.   History reviewed. No pertinent surgical history.  Prior to Admission medications   Medication Sig Start Date End Date Taking? Authorizing Provider  fexofenadine (ALLEGRA ALLERGY) 180 MG tablet Take 1 tablet (180 mg total) by mouth daily. 06/16/19   Horald Pollen, MD  fluticasone [facility]) 50 MCG/ACT nasal spray Place 2 sprays into both nostrils daily. 06/16/19   Horald Pollen, MD  hydrochlorothiazide (HYDRODIURIL) 12.5 MG tablet TAKE ONE TABLET BY MOUTH DAILY 04/08/19   Horald Pollen, MD  losartan (COZAAR) 100 MG tablet TAKE ONE TABLET BY MOUTH DAILY 04/08/19   Horald Pollen, MD  losartan-hydrochlorothiazide [facility]) 100-12.5 MG tablet Take 1 tablet by mouth daily. 06/16/19   Horald Pollen, MD  omeprazole (PRILOSEC) 20 MG capsule  Take 1 capsule (20 mg total) by mouth daily. 10/10/19 10/09/20  Blake Divine, MD    Allergies Ibuprofen  Family History  Problem Relation Age of Onset  . Stroke Mother   . Heart disease Father   . Hypertension Brother     Social History Social History   Tobacco Use  . Smoking status: Former Research scientist (life sciences)  . Smokeless tobacco: Former Network engineer Use Topics  . Alcohol use: Yes    Alcohol/week: 7.0 standard drinks    Types: 7 Glasses of wine per week  . Drug use: No    Review of Systems  Constitutional: No fever/chills Eyes: No visual changes. ENT: No sore throat. Cardiovascular: Positive for chest pain. Respiratory: Denies shortness of breath. Gastrointestinal: No abdominal pain.  No nausea, no vomiting.  No diarrhea.  No constipation. Genitourinary: Negative for dysuria. Musculoskeletal: Negative for back pain. Skin: Negative for rash. Neurological: Negative for headaches, focal weakness or numbness.  Positive for left arm tingling.  ____________________________________________   PHYSICAL EXAM:  VITAL SIGNS: ED Triage Vitals  Enc Vitals Group     BP 10/10/19 1609 (!) 137/98     Pulse Rate 10/10/19 1609 65     Resp 10/10/19 1609 18     Temp 10/10/19 1609 97.7 F (36.5 C)     Temp Source 10/10/19 1609 Oral     SpO2 10/10/19 1609 94 %     Weight 10/10/19 1606 220 lb (99.8 kg)     Height 10/10/19  1606 5\' 7"  (1.702 m)     Head Circumference --      Peak Flow --      Pain Score 10/10/19 1606 2     Pain Loc --      Pain Edu? --      Excl. in GC? --     Constitutional: Alert and oriented. Eyes: Conjunctivae are normal. Head: Atraumatic. Nose: No congestion/rhinnorhea. Mouth/Throat: Mucous membranes are moist. Neck: Normal ROM Cardiovascular: Normal rate, regular rhythm. Grossly normal heart sounds.  2+ radial pulses bilaterally. Respiratory: Normal respiratory effort.  No retractions. Lungs CTAB. Gastrointestinal: Soft and nontender. No  distention. Genitourinary: deferred Musculoskeletal: No lower extremity tenderness nor edema. Neurologic:  Normal speech and language. No gross focal neurologic deficits are appreciated. Skin:  Skin is warm, dry and intact. No rash noted. Psychiatric: Mood and affect are normal. Speech and behavior are normal.  ____________________________________________   LABS (all labs ordered are listed, but only abnormal results are displayed)  Labs Reviewed  BASIC METABOLIC PANEL - Abnormal; Notable for the following components:      Result Value   Glucose, Bld 107 (*)    All other components within normal limits  CBC  TROPONIN I (HIGH SENSITIVITY)  TROPONIN I (HIGH SENSITIVITY)   ____________________________________________  EKG  ED ECG REPORT I, 10/12/19, the attending physician, personally viewed and interpreted this ECG.   Date: 10/10/2019  EKG Time: 16:01  Rate: 70  Rhythm: normal sinus rhythm  Axis: LAD  Intervals:none  ST&T Change: None   PROCEDURES  Procedure(s) performed (including Critical Care):  Procedures   ____________________________________________   INITIAL IMPRESSION / ASSESSMENT AND PLAN / ED COURSE       54 year old male with past medical history of hypertension presents to the ED complaining of episode of chest pain as well as left arm tingling earlier today that has now resolved.  Symptoms seem to come on after a meal and patient states he has had similar discomfort in the past.  Symptoms sound atypical for ACS and more consistent with gastritis/GERD.  EKG shows no acute ischemic changes and chest x-ray negative for acute process.  2 sets of troponin are negative and given his heart score of less than 4, this makes ACS very unlikely and he is appropriate for discharge home with outpatient follow-up.  He was counseled to follow-up with his PCP and otherwise return to the ED for new or worsening symptoms.  Patient agrees with plan.       ____________________________________________   FINAL CLINICAL IMPRESSION(S) / ED DIAGNOSES  Final diagnoses:  Nonspecific chest pain     ED Discharge Orders         Ordered    omeprazole (PRILOSEC) 20 MG capsule  Daily     Discontinue  Reprint     10/10/19 1905           Note:  This document was prepared using Dragon voice recognition software and may include unintentional dictation errors.   10/12/19, MD 10/10/19 10/12/19

## 2019-10-10 NOTE — ED Triage Notes (Signed)
First RN Note: Pt presents to ED via POV from work with c/o L CP and L arm pain. Pt pulled to triage to have EKG done.

## 2019-10-10 NOTE — ED Triage Notes (Addendum)
Pt to ED with c/o of epi gastric pain and left arm numbness that started approx 1 hour ago. Pt denies NV, SOB and/or dizziness/lightheadedness.

## 2019-12-18 ENCOUNTER — Ambulatory Visit: Payer: Self-pay | Admitting: Emergency Medicine

## 2020-07-04 IMAGING — CR DG CHEST 2V
2 series · 2 of 2 positions shown · non-contrast
Comparison: Prior chest radiographs 08/22/2006

CLINICAL DATA: Chest pain. Additional history provided: Left-sided
chest pain and left arm pain with tingling, history of hypertension.

EXAM:
CHEST - 2 VIEW

[chest lat]
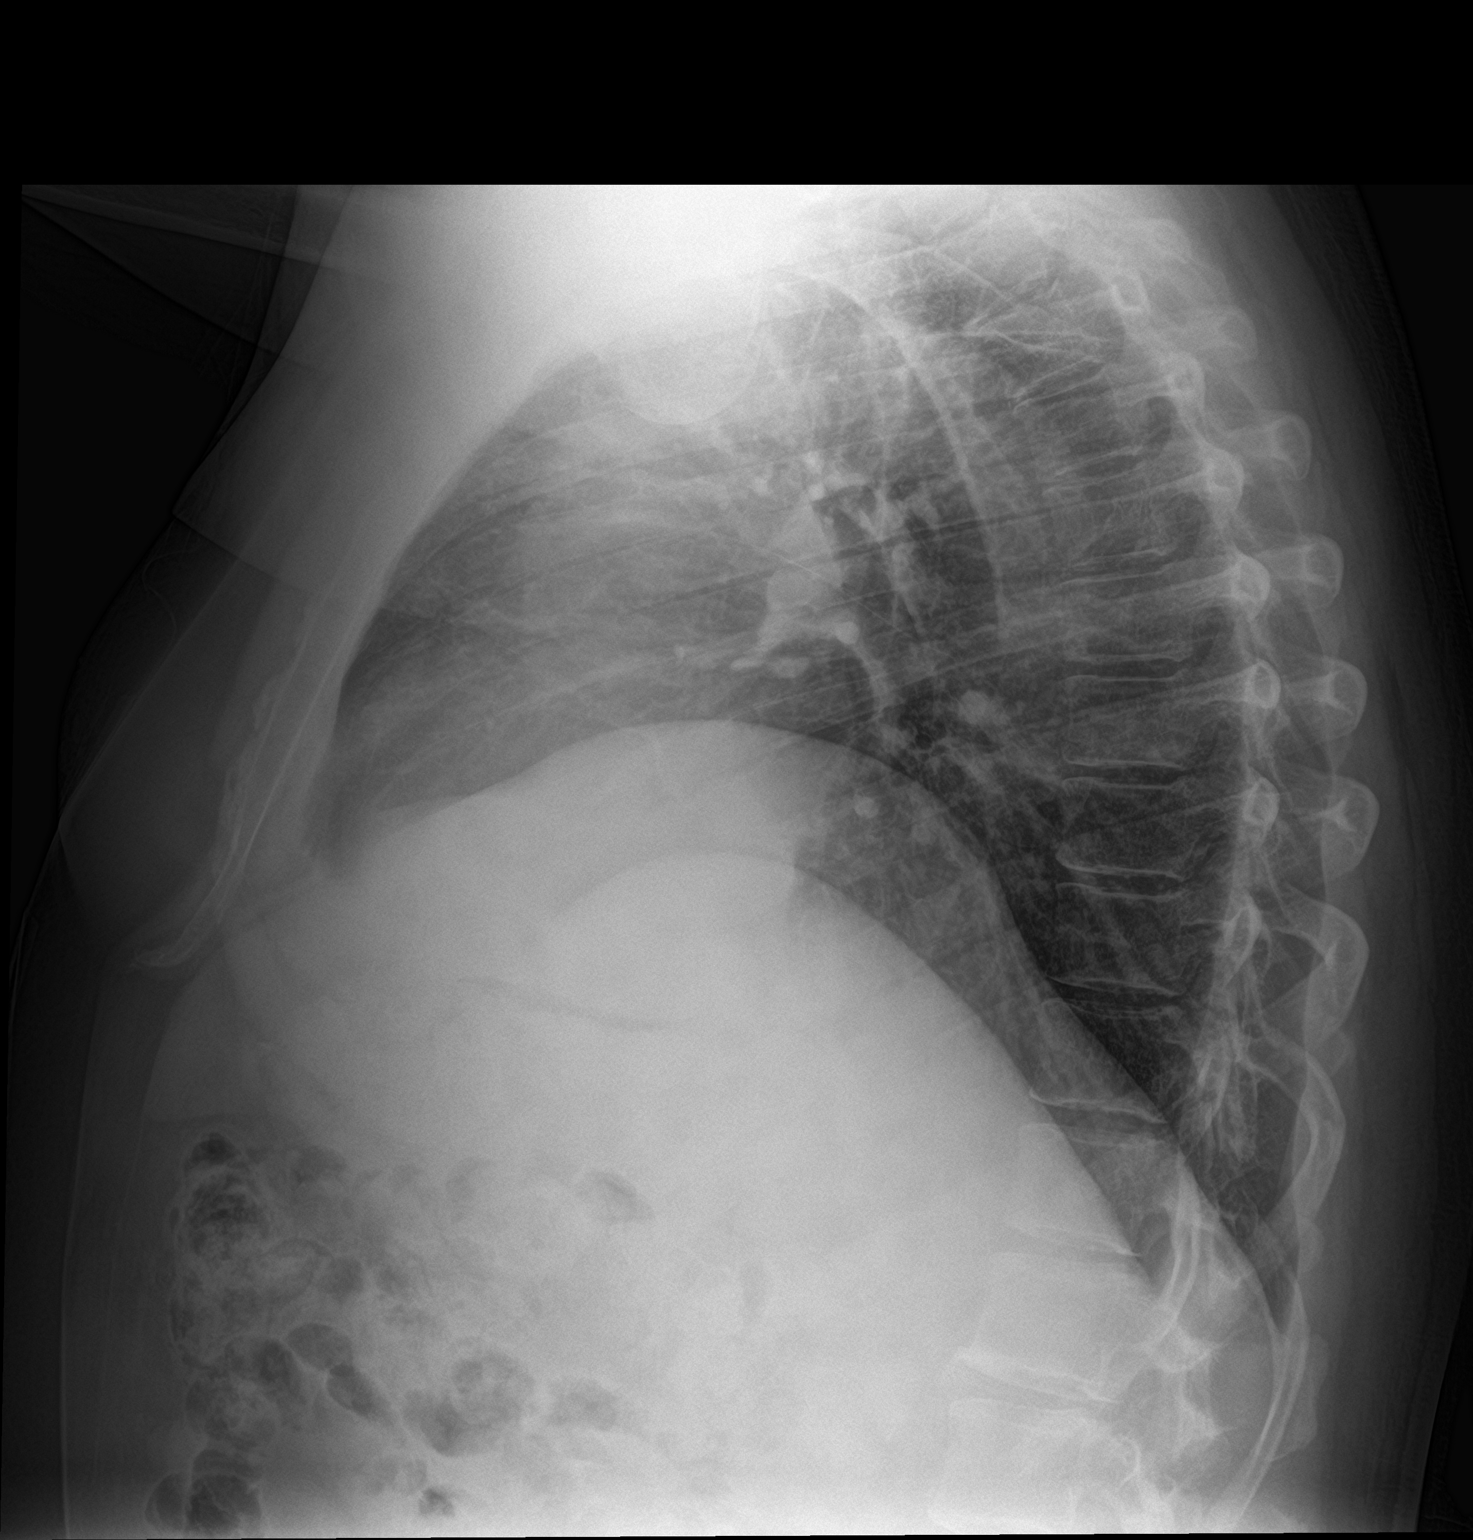

[chest pa]
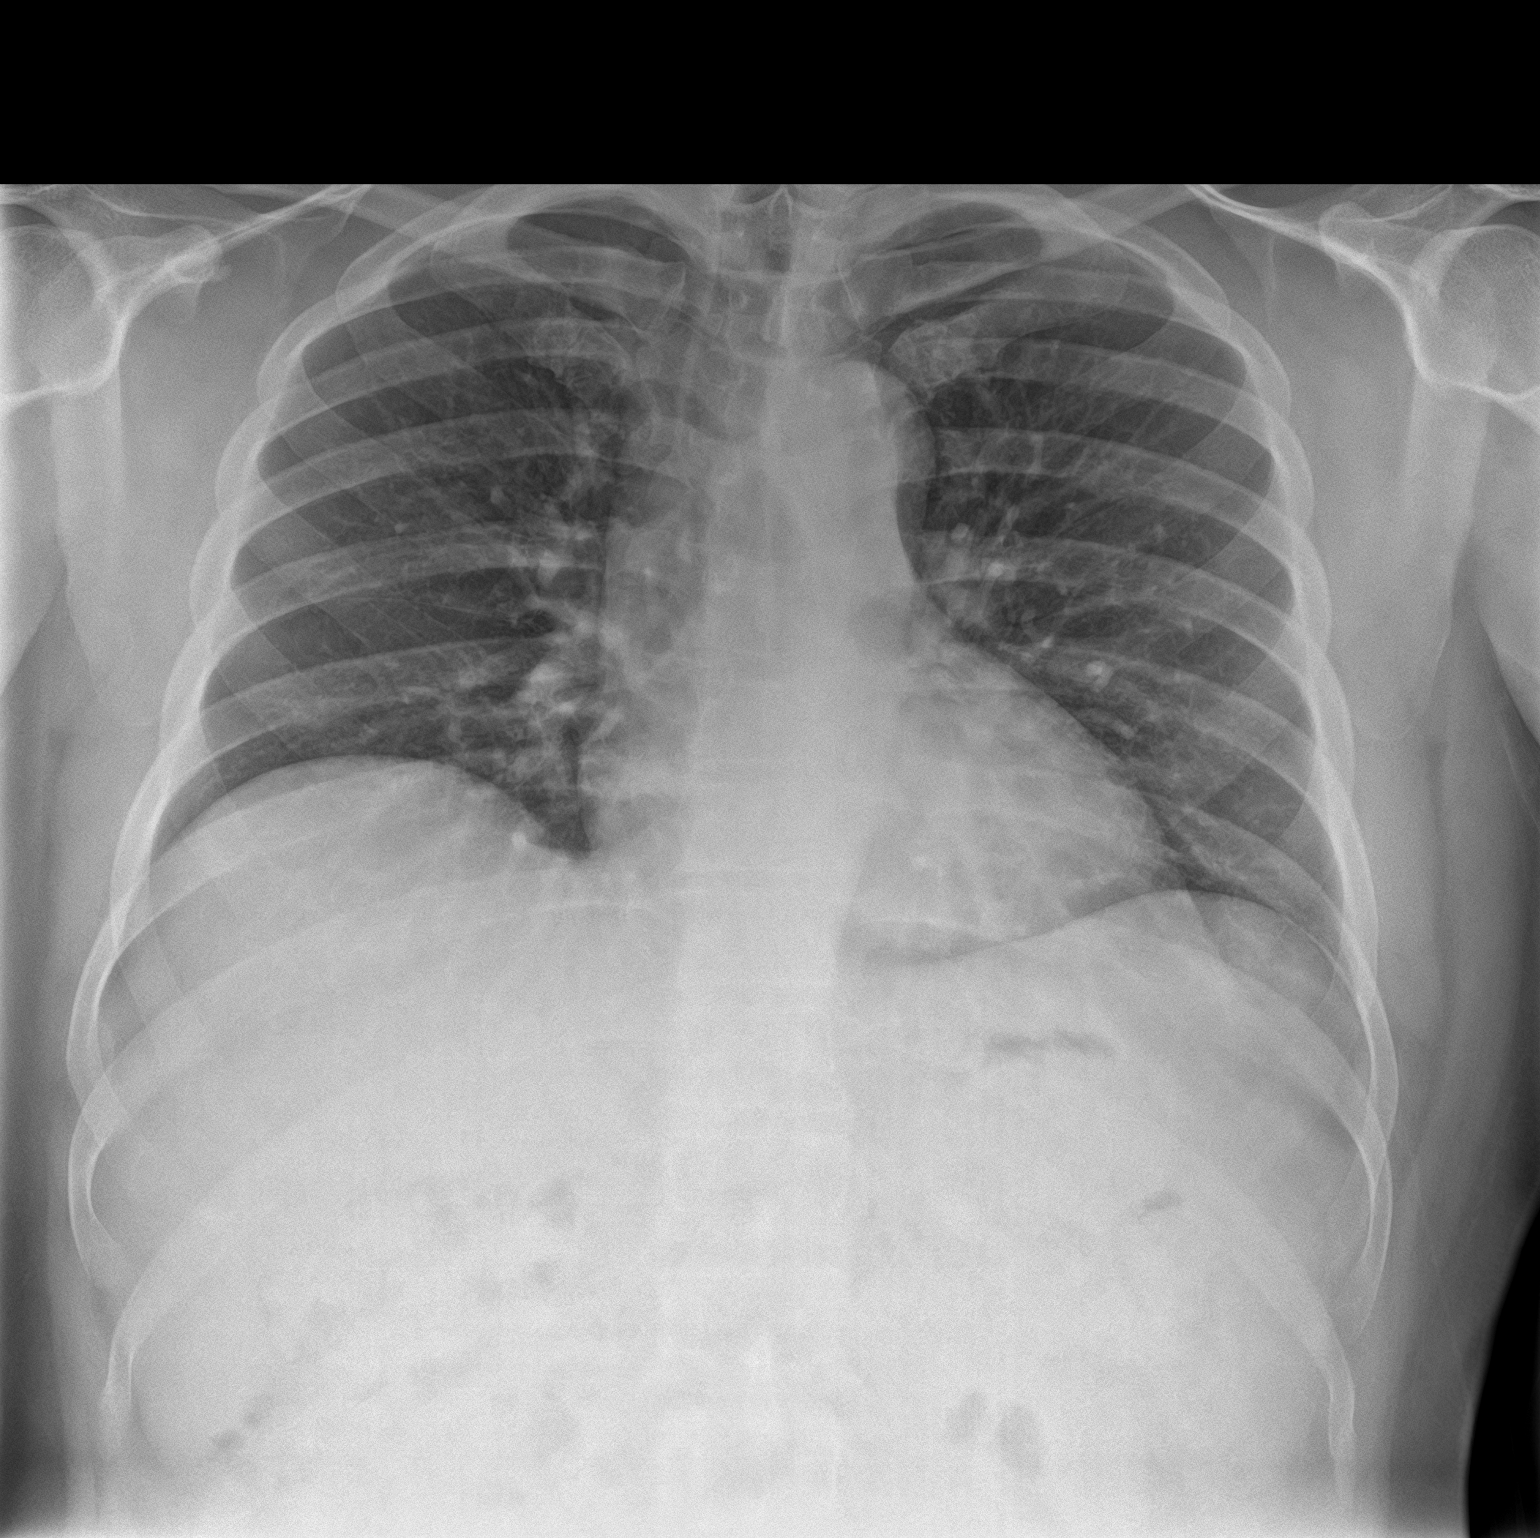

[2 of 2 positions shown; findings below may reference images not displayed]

FINDINGS: Heart size within normal limits. Question mild central pulmonary
vascular congestion. No appreciable airspace consolidation or frank
pulmonary edema. No evidence of pleural effusion or pneumothorax. No
acute bony abnormality is identified.
IMPRESSION: Mild central pulmonary vascular congestion is questioned.

No appreciable airspace consolidation or frank pulmonary edema.

## 2020-07-05 ENCOUNTER — Other Ambulatory Visit: Payer: Self-pay | Admitting: Emergency Medicine

## 2020-07-05 DIAGNOSIS — I10 Essential (primary) hypertension: Secondary | ICD-10-CM

## 2021-05-12 ENCOUNTER — Telehealth: Payer: Self-pay | Admitting: Emergency Medicine

## 2021-05-12 DIAGNOSIS — I10 Essential (primary) hypertension: Secondary | ICD-10-CM

## 2021-05-12 NOTE — Telephone Encounter (Signed)
Patient is requesting a refill on losartan (COZAAR) 100 MG tablet  Patient is requesting a 2 month supply, informed pt he has not been seen by provider since 06-16-2019, encouraged pt to schedule an appt, pt declined due to being out of town for the next 2 months  Please advise

## 2021-05-13 MED ORDER — LOSARTAN POTASSIUM-HCTZ 100-12.5 MG PO TABS: 1.0000 | ORAL_TABLET | Freq: Every day | ORAL | 2 refills | Status: DC

## 2021-05-13 NOTE — Telephone Encounter (Signed)
Refilled medication

## 2021-07-27 ENCOUNTER — Other Ambulatory Visit: Payer: Self-pay | Admitting: Emergency Medicine

## 2021-07-27 DIAGNOSIS — I10 Essential (primary) hypertension: Secondary | ICD-10-CM

## 2021-11-16 ENCOUNTER — Other Ambulatory Visit: Payer: Self-pay | Admitting: Emergency Medicine

## 2021-11-16 DIAGNOSIS — I10 Essential (primary) hypertension: Secondary | ICD-10-CM

## 2022-01-23 ENCOUNTER — Other Ambulatory Visit: Payer: Self-pay | Admitting: Emergency Medicine

## 2022-01-23 DIAGNOSIS — I10 Essential (primary) hypertension: Secondary | ICD-10-CM

## 2022-03-13 ENCOUNTER — Ambulatory Visit: Payer: Self-pay | Admitting: Emergency Medicine

## 2022-03-13 ENCOUNTER — Encounter: Payer: Self-pay | Admitting: Emergency Medicine

## 2022-03-13 VITALS — BP 136/84 | HR 72 | Temp 98.0°F | Ht 67.0 in | Wt 227.4 lb

## 2022-03-13 DIAGNOSIS — Z125 Encounter for screening for malignant neoplasm of prostate: Secondary | ICD-10-CM

## 2022-03-13 DIAGNOSIS — Z1211 Encounter for screening for malignant neoplasm of colon: Secondary | ICD-10-CM

## 2022-03-13 DIAGNOSIS — I10 Essential (primary) hypertension: Secondary | ICD-10-CM | POA: Insufficient documentation

## 2022-03-13 LAB — CBC WITH DIFFERENTIAL/PLATELET
Basophils Absolute: 0.1 10*3/uL (ref 0.0–0.1)
Basophils Relative: 1.2 % (ref 0.0–3.0)
Eosinophils Absolute: 0.4 10*3/uL (ref 0.0–0.7)
Eosinophils Relative: 5.5 % — ABNORMAL HIGH (ref 0.0–5.0)
HCT: 45 % (ref 39.0–52.0)
Hemoglobin: 15.6 g/dL (ref 13.0–17.0)
Lymphocytes Relative: 21.3 % (ref 12.0–46.0)
Lymphs Abs: 1.6 10*3/uL (ref 0.7–4.0)
MCHC: 34.6 g/dL (ref 30.0–36.0)
MCV: 90.1 fl (ref 78.0–100.0)
Monocytes Absolute: 0.7 10*3/uL (ref 0.1–1.0)
Monocytes Relative: 9 % (ref 3.0–12.0)
Neutro Abs: 4.6 10*3/uL (ref 1.4–7.7)
Neutrophils Relative %: 63 % (ref 43.0–77.0)
Platelets: 307 10*3/uL (ref 150.0–400.0)
RBC: 4.99 Mil/uL (ref 4.22–5.81)
RDW: 14 % (ref 11.5–15.5)
WBC: 7.3 10*3/uL (ref 4.0–10.5)

## 2022-03-13 LAB — LIPID PANEL
Cholesterol: 234 mg/dL — ABNORMAL HIGH (ref 0–200)
HDL: 46.3 mg/dL (ref >39.00)
LDL Cholesterol: 161 mg/dL — ABNORMAL HIGH (ref 0–99)
NonHDL: 187.33
Total CHOL/HDL Ratio: 5
Triglycerides: 131 mg/dL (ref 0.0–149.0)
VLDL: 26.2 mg/dL (ref 0.0–40.0)

## 2022-03-13 LAB — COMPREHENSIVE METABOLIC PANEL
ALT: 18 U/L (ref 0–53)
AST: 17 U/L (ref 0–37)
Albumin: 4.4 g/dL (ref 3.5–5.2)
Alkaline Phosphatase: 91 U/L (ref 39–117)
BUN: 17 mg/dL (ref 6–23)
CO2: 29 mEq/L (ref 19–32)
Calcium: 9.2 mg/dL (ref 8.4–10.5)
Chloride: 99 mEq/L (ref 96–112)
Creatinine, Ser: 0.81 mg/dL (ref 0.40–1.50)
GFR: 99.64 mL/min (ref >60.00)
Glucose, Bld: 93 mg/dL (ref 70–99)
Potassium: 3.9 mEq/L (ref 3.5–5.1)
Sodium: 136 mEq/L (ref 135–145)
Total Bilirubin: 0.4 mg/dL (ref 0.2–1.2)
Total Protein: 7.3 g/dL (ref 6.0–8.3)

## 2022-03-13 LAB — PSA: PSA: 0.57 ng/mL (ref 0.10–4.00)

## 2022-03-13 LAB — HEMOGLOBIN A1C: Hgb A1c MFr Bld: 5.7 % (ref 4.6–6.5)

## 2022-03-13 MED ORDER — LOSARTAN POTASSIUM-HCTZ 100-12.5 MG PO TABS: 1.0000 | ORAL_TABLET | Freq: Every day | ORAL | 3 refills | Status: DC

## 2022-03-13 NOTE — Progress Notes (Signed)
Jacob Kelley 56 y.o.   Chief Complaint  Patient presents with   Follow-up    F/u appt, medication refill appt, has not been seen in about 2 years. No concerns     HISTORY OF PRESENT ILLNESS: This is a 56 y.o. male here for follow-up of hypertension and general health. Overall doing well.  Has no complaints or medical concerns today. BP Readings from Last 3 Encounters:  10/10/19 (!) 142/95  06/16/19 133/88  06/24/18 128/82     HPI   Prior to Admission medications   Medication Sig Start Date End Date Taking? Authorizing Provider  losartan-hydrochlorothiazide (HYZAAR) 100-12.5 MG tablet Take 1 tablet by mouth daily. 05/13/21  Yes Dakoda Bassette, Eilleen Kempf, MD    Allergies  Allergen Reactions   Ibuprofen Swelling and Other (See Comments)    Reaction:  Facial/eye swelling     There are no problems to display for this patient.   Past Medical History:  Diagnosis Date   Allergy    Hypertension     No past surgical history on file.  Social History   Socioeconomic History   Marital status: Married    Spouse name: Not on file   Number of children: Not on file   Years of education: Not on file   Highest education level: Not on file  Occupational History   Not on file  Tobacco Use   Smoking status: Former   Smokeless tobacco: Former  Substance and Sexual Activity   Alcohol use: Yes    Alcohol/week: 7.0 standard drinks of alcohol    Types: 7 Glasses of wine per week   Drug use: No   Sexual activity: Not on file  Other Topics Concern   Not on file  Social History Narrative   Not on file   Social Determinants of Health   Financial Resource Strain: Not on file  Food Insecurity: Not on file  Transportation Needs: Not on file  Physical Activity: Not on file  Stress: Not on file  Social Connections: Not on file  Intimate Partner Violence: Not on file    Family History  Problem Relation Age of Onset   Stroke Mother    Heart disease Father     Hypertension Brother      Review of Systems  Constitutional: Negative.  Negative for chills and fever.  HENT: Negative.  Negative for congestion and sore throat.   Respiratory: Negative.  Negative for cough and shortness of breath.   Gastrointestinal: Negative.  Negative for abdominal pain, nausea and vomiting.  Genitourinary: Negative.  Negative for frequency, hematuria and urgency.  Skin: Negative.  Negative for rash.  Neurological: Negative.  Negative for dizziness and headaches.  All other systems reviewed and are negative.  Today's Vitals   03/13/22 1340  BP: 136/84  Pulse: 72  Temp: 98 F (36.7 C)  TempSrc: Oral  SpO2: 94%  Weight: 227 lb 6 oz (103.1 kg)  Height: 5\' 7"  (1.702 m)   Body mass index is 35.61 kg/m.   Physical Exam Vitals reviewed.  Constitutional:      Appearance: Normal appearance.  HENT:     Head: Normocephalic.     Mouth/Throat:     Mouth: Mucous membranes are moist.     Pharynx: Oropharynx is clear.  Eyes:     Extraocular Movements: Extraocular movements intact.     Conjunctiva/sclera: Conjunctivae normal.     Pupils: Pupils are equal, round, and reactive to light.  Cardiovascular:  Rate and Rhythm: Normal rate and regular rhythm.     Pulses: Normal pulses.     Heart sounds: Normal heart sounds.  Pulmonary:     Effort: Pulmonary effort is normal.     Breath sounds: Normal breath sounds.  Musculoskeletal:        General: Normal range of motion.     Cervical back: No tenderness.  Lymphadenopathy:     Cervical: No cervical adenopathy.  Skin:    General: Skin is warm and dry.  Neurological:     General: No focal deficit present.     Mental Status: He is alert and oriented to person, place, and time.  Psychiatric:        Mood and Affect: Mood normal.        Behavior: Behavior normal.      ASSESSMENT & PLAN: A total of 37 minutes was spent with the patient and counseling/coordination of care regarding preparing for this visit,  review of most recent office visit notes, cardiovascular risks associated with hypertension, diagnosis of hypertension and management, education on nutrition, review of health maintenance items, need for blood work, prognosis, documentation, need for follow-up.  Problem List Items Addressed This Visit       Cardiovascular and Mediastinum   Essential hypertension - Primary    Well-controlled hypertension. Continue losartan-HCTZ 100-12.5 mg daily. Cardiovascular risks associated with hypertension discussed. Diet and nutrition discussed. Blood work done today. Follow-up in 6 months.      Relevant Medications   losartan-hydrochlorothiazide (HYZAAR) 100-12.5 MG tablet   Other Relevant Orders   Comprehensive metabolic panel   CBC with Differential/Platelet   Lipid panel   Hemoglobin A1c   Other Visit Diagnoses     Prostate cancer screening       Relevant Orders   PSA   Colon cancer screening       Relevant Orders   Ambulatory referral to Gastroenterology      Patient Instructions  Hipertensin en los adultos Hypertension, Adult El trmino hipertensin es otra forma de denominar a la presin arterial elevada. La presin arterial elevada fuerza al corazn a trabajar ms para bombear la sangre. Esto puede causar problemas con el paso del [place]. Una lectura de presin arterial est compuesta por 2 nmeros. Hay un nmero superior (sistlico) sobre un nmero inferior (diastlico). Lo ideal es tener la presin arterial por debajo de 120/80. Cules son las causas? Se desconoce la causa de esta afeccin. Algunas otras afecciones pueden provocar presin arterial elevada. Qu incrementa el riesgo? Algunos factores del estilo de vida pueden hacer que tenga ms probabilidades de desarrollar presin arterial elevada: Fumar. No hacer la cantidad suficiente de actividad fsica o ejercicio. Tener sobrepeso. Consumir mucha grasa, azcar, caloras o sal (sodio) en su dieta. Beber alcohol en  exceso. Otros factores de riesgo son los siguientes: Tener alguna de estas afecciones: Enfermedad cardaca. Diabetes. Colesterol alto. Enfermedad renal. Apnea obstructiva del sueo. Tener antecedentes familiares de presin arterial elevada y colesterol elevado. Edad. El riesgo aumenta con la edad. Estrs. Cules son los signos o sntomas? Es posible que la presin arterial alta no cause sntomas. La presin arterial muy alta (crisis hipertensiva) puede provocar: Dolor de cabeza. Latidos cardacos acelerados o irregulares (palpitaciones). Falta de aire. Hemorragia nasal. Vomitar o sentir ganas de vomitar (nuseas). Cambios en la forma de ver. Dolor muy intenso en el pecho. Sensacin de [employer]. Convulsiones. Cmo se trata? Esta afeccin se trata haciendo cambios saludables en el estilo de vida, por ejemplo: Consumir  alimentos saludables. Hacer ms ejercicio. Beber menos alcohol. El mdico puede recetarle medicamentos si los cambios en el estilo de vida no son lo suficientemente eficaces y si: El nmero de arriba est por encima de 130. El nmero de abajo est por encima de 80. Su presin arterial personal ideal puede variar. Siga estas indicaciones en su casa: Comida y bebida  Si se lo dicen, siga el plan de alimentacin de DASH (Dietary Approaches to Stop Hypertension, Maneras de alimentarse para detener la hipertensin). Para seguir este plan: Llene la mitad del plato de cada comida con frutas y verduras. Llene un cuarto del plato de cada comida con cereales integrales. Los cereales integrales incluyen pasta integral, arroz integral y pan integral. Coma y beba productos lcteos con bajo contenido de grasa, como leche descremada o yogur bajo en grasas. Llene un cuarto del plato de cada comida con protenas bajas en grasa (magras). Las protenas bajas en grasa incluyen pescado, pollo sin piel, huevos, frijoles y tofu. Evite consumir carne grasa, carne curada y procesada, o pollo  con piel. Evite consumir alimentos prehechos o procesados. Limite la cantidad de sal en su dieta a menos de 1500 mg por da. No beba alcohol si: El mdico le indica que no lo haga. Est embarazada, puede estar embarazada o est tratando de [place]. Si bebe alcohol: Limite la cantidad que bebe a lo siguiente: De 0 a 1 medida por da para las mujeres. De 0 a 2 medidas por da para los hombres. Sepa cunta cantidad de alcohol hay en las bebidas que toma. En los [address], una medida equivale a una botella de cerveza de 12 oz (355 ml), un vaso de vino de 5 oz (148 ml) o un vaso de una bebida alcohlica de alta graduacin de 1 oz (44 ml). Estilo de vida  Trabaje con su mdico para mantenerse en un peso saludable o para perder peso. Pregntele a su mdico cul es el peso recomendable para usted. Realice al menos 30 minutos de ejercicio que haga que se acelere su corazn (ejercicio Magazine features editor) la [employer] de la [place]. Estos pueden incluir caminar, nadar o andar en bicicleta. Realice al menos 30 minutos de ejercicio que fortalezca sus msculos (ejercicios de resistencia) al menos 3 das a la [place]. Estos pueden incluir levantar pesas o hacer [place]. No fume ni consuma ningn producto que contenga nicotina o tabaco. Si necesita ayuda para dejar de consumir estos productos, consulte al mdico. Controle su presin arterial en su casa tal como le indic el mdico. Concurra a todas las visitas de seguimiento. Medicamentos Use los medicamentos de venta libre y los recetados solamente como se lo haya indicado el mdico. Siga cuidadosamente las indicaciones. No omita las dosis de medicamentos para la presin arterial. Los medicamentos pierden eficacia si omite dosis. El hecho de omitir las dosis tambin [place] el riesgo de otros problemas. Pregntele a su mdico a qu efectos secundarios o reacciones a los Museum/gallery curator. Comunquese con un mdico si: Piensa  que tiene [place] reaccin a los medicamentos que est tomando. Tiene dolores de cabeza frecuentes. Siente mareos. Tiene hinchazn en los tobillos. Tiene problemas de visin. Solicite ayuda de inmediato si: Siente un dolor de cabeza muy intenso. Empieza a sentirse desorientado (confundido). Se siente dbil o adormecido. Siente que va a desmayarse. Tiene un dolor muy intenso en: Pecho. Vientre (abdomen). Vomita ms de una vez. Tiene dificultad para respirar. Estos sntomas pueden Customer service manager. Solicite ayuda de inmediato.  Llame al 911. No espere a ver si los sntomas desaparecen. No conduzca por sus propios medios OfficeMax Incorporatedhasta el hospital. Resumen El trmino hipertensin es otra forma de denominar a la presin arterial elevada. La presin arterial elevada fuerza al corazn a trabajar ms para bombear la sangre. Para la Franklin Resourcesmayora de las personas, una presin arterial normal es menor que 120/80. Las decisiones saludables pueden ayudarle a disminuir su presin arterial. Si no puede bajar su presin arterial mediante decisiones saludables, es posible que deba tomar medicamentos. Esta informacin no tiene Theme park managercomo fin reemplazar el consejo del mdico. Asegrese de hacerle al mdico cualquier pregunta que tenga. Document Revised: 02/10/2021 Document Reviewed: 02/10/2021 Elsevier Patient Education  2023 Elsevier Inc.    Edwina BarthMiguel Beth Spackman, MD [facility]

## 2022-03-13 NOTE — Patient Instructions (Signed)
Hipertensin en los adultos Hypertension, Adult El trmino hipertensin es otra forma de denominar a la presin arterial elevada. La presin arterial elevada fuerza al corazn a trabajar ms para bombear la sangre. Esto puede causar problemas con el paso del tiempo. Una lectura de presin arterial est compuesta por 2 nmeros. Hay un nmero superior (sistlico) sobre un nmero inferior (diastlico). Lo ideal es tener la presin arterial por debajo de 120/80. Cules son las causas? Se desconoce la causa de esta afeccin. Algunas otras afecciones pueden provocar presin arterial elevada. Qu incrementa el riesgo? Algunos factores del estilo de vida pueden hacer que tenga ms probabilidades de desarrollar presin arterial elevada: Fumar. No hacer la cantidad suficiente de actividad fsica o ejercicio. Tener sobrepeso. Consumir mucha grasa, azcar, caloras o sal (sodio) en su dieta. Beber alcohol en exceso. Otros factores de riesgo son los siguientes: Tener alguna de estas afecciones: Enfermedad cardaca. Diabetes. Colesterol alto. Enfermedad renal. Apnea obstructiva del sueo. Tener antecedentes familiares de presin arterial elevada y colesterol elevado. Edad. El riesgo aumenta con la edad. Estrs. Cules son los signos o sntomas? Es posible que la presin arterial alta no cause sntomas. La presin arterial muy alta (crisis hipertensiva) puede provocar: Dolor de cabeza. Latidos cardacos acelerados o irregulares (palpitaciones). Falta de aire. Hemorragia nasal. Vomitar o sentir ganas de vomitar (nuseas). Cambios en la forma de ver. Dolor muy intenso en el pecho. Sensacin de mareo. Convulsiones. Cmo se trata? Esta afeccin se trata haciendo cambios saludables en el estilo de vida, por ejemplo: Consumir alimentos saludables. Hacer ms ejercicio. Beber menos alcohol. El mdico puede recetarle medicamentos si los cambios en el estilo de vida no son lo suficientemente  eficaces y si: El nmero de arriba est por encima de 130. El nmero de abajo est por encima de 80. Su presin arterial personal ideal puede variar. Siga estas indicaciones en su casa: Comida y bebida  Si se lo dicen, siga el plan de alimentacin de DASH (Dietary Approaches to Stop Hypertension, Maneras de alimentarse para detener la hipertensin). Para seguir este plan: Llene la mitad del plato de cada comida con frutas y verduras. Llene un cuarto del plato de cada comida con cereales integrales. Los cereales integrales incluyen pasta integral, arroz integral y pan integral. Coma y beba productos lcteos con bajo contenido de grasa, como leche descremada o yogur bajo en grasas. Llene un cuarto del plato de cada comida con protenas bajas en grasa (magras). Las protenas bajas en grasa incluyen pescado, pollo sin piel, huevos, frijoles y tofu. Evite consumir carne grasa, carne curada y procesada, o pollo con piel. Evite consumir alimentos prehechos o procesados. Limite la cantidad de sal en su dieta a menos de 1500 mg por da. No beba alcohol si: El mdico le indica que no lo haga. Est embarazada, puede estar embarazada o est tratando de quedar embarazada. Si bebe alcohol: Limite la cantidad que bebe a lo siguiente: De 0 a 1 medida por da para las mujeres. De 0 a 2 medidas por da para los hombres. Sepa cunta cantidad de alcohol hay en las bebidas que toma. En los [place], una medida equivale a una botella de cerveza de 12 oz (355 ml), un vaso de vino de 5 oz (148 ml) o un vaso de una bebida alcohlica de alta graduacin de 1 oz (44 ml). Estilo de vida  Trabaje con su mdico para mantenerse en un peso saludable o para perder peso. Pregntele a su mdico cul es el peso recomendable para   usted. Realice al menos 30 minutos de ejercicio que haga que se acelere su corazn (ejercicio aerbico) la mayora de los das de la semana. Estos pueden incluir caminar, nadar o andar en  bicicleta. Realice al menos 30 minutos de ejercicio que fortalezca sus msculos (ejercicios de resistencia) al menos 3 das a la semana. Estos pueden incluir levantar pesas o hacer Pil[place]. No fume ni consuma ningn producto que contenga nicotina o tabaco. Si necesita ayuda para dejar de consumir estos productos, consulte al mdico. Controle su presin arterial en su casa tal como le indic el mdico. Concurra a todas las visitas de seguimiento. Medicamentos Use los medicamentos de venta libre y los recetados solamente como se lo haya indicado el mdico. Siga cuidadosamente las indicaciones. No omita las dosis de medicamentos para la presin arterial. Los medicamentos pierden eficacia si omite dosis. El hecho de omitir las dosis tambin aumenta el riesgo de otros problemas. Pregntele a su mdico a qu efectos secundarios o reacciones a los medicamentos debe prestar atencin. Comunquese con un mdico si: Piensa que tiene una reaccin a los medicamentos que est tomando. Tiene dolores de cabeza frecuentes. Siente mareos. Tiene hinchazn en los tobillos. Tiene problemas de visin. Solicite ayuda de inmediato si: Siente un dolor de cabeza muy intenso. Empieza a sentirse desorientado (confundido). Se siente dbil o adormecido. Siente que va a desmayarse. Tiene un dolor muy intenso en: Pecho. Vientre (abdomen). Vomita ms de una vez. Tiene dificultad para respirar. Estos sntomas pueden indicar una emergencia. Solicite ayuda de inmediato. Llame al 911. No espere a ver si los sntomas desaparecen. No conduzca por sus propios medios hasta el hospital. Resumen El trmino hipertensin es otra forma de denominar a la presin arterial elevada. La presin arterial elevada fuerza al corazn a trabajar ms para bombear la sangre. Para la mayora de las personas, una presin arterial normal es menor que 120/80. Las decisiones saludables pueden ayudarle a disminuir su presin arterial. Si no puede  bajar su presin arterial mediante decisiones saludables, es posible que deba tomar medicamentos. Esta informacin no tiene como fin reemplazar el consejo del mdico. Asegrese de hacerle al mdico cualquier pregunta que tenga. Document Revised: 02/10/2021 Document Reviewed: 02/10/2021 Elsevier Patient Education  2023 Elsevier Inc.  

## 2022-03-13 NOTE — Assessment & Plan Note (Signed)
Well-controlled hypertension. Continue losartan-HCTZ 100-12.5 mg daily. Cardiovascular risks associated with hypertension discussed. Diet and nutrition discussed. Blood work done today. Follow-up in 6 months.

## 2022-03-14 ENCOUNTER — Other Ambulatory Visit: Payer: Self-pay | Admitting: Emergency Medicine

## 2022-03-14 DIAGNOSIS — E785 Hyperlipidemia, unspecified: Secondary | ICD-10-CM

## 2022-03-14 MED ORDER — ATORVASTATIN CALCIUM 20 MG PO TABS: 20.0000 mg | ORAL_TABLET | Freq: Every day | ORAL | 3 refills | Status: DC

## 2022-06-29 ENCOUNTER — Encounter: Payer: Self-pay | Admitting: Emergency Medicine

## 2022-06-29 ENCOUNTER — Ambulatory Visit (INDEPENDENT_AMBULATORY_CARE_PROVIDER_SITE_OTHER): Payer: BC Managed Care – PPO | Admitting: Emergency Medicine

## 2022-06-29 VITALS — BP 130/84 | HR 66 | Temp 97.9°F | Ht 67.0 in | Wt 232.2 lb

## 2022-06-29 DIAGNOSIS — E785 Hyperlipidemia, unspecified: Secondary | ICD-10-CM | POA: Diagnosis not present

## 2022-06-29 DIAGNOSIS — K2900 Acute gastritis without bleeding: Secondary | ICD-10-CM | POA: Diagnosis not present

## 2022-06-29 DIAGNOSIS — I10 Essential (primary) hypertension: Secondary | ICD-10-CM | POA: Diagnosis not present

## 2022-06-29 DIAGNOSIS — K2970 Gastritis, unspecified, without bleeding: Secondary | ICD-10-CM | POA: Insufficient documentation

## 2022-06-29 MED ORDER — ATORVASTATIN CALCIUM 20 MG PO TABS: 20.0000 mg | ORAL_TABLET | Freq: Every day | ORAL | 3 refills | Status: AC

## 2022-06-29 NOTE — Assessment & Plan Note (Signed)
Well-controlled hypertension. Continue Hyzaar 100-12.5 mg daily Cardiovascular risk associated with hypertension discussed Dietary approaches to stop hypertension discussed Benefits of exercise discussed

## 2022-06-29 NOTE — Patient Instructions (Signed)
Gastritis en adultos Gastritis, Adult La gastritis es irritacin e hinchazn (inflamacin) del estmago. Hay dos tipos de gastritis: Gastritis aguda. Este tipo se desarrolla rpidamente. Gastritis crnica. Este tipo es mucho ms frecuente. Se desarrolla lentamente y dura un largo tiempo. Es importante recibir ayuda para esta afeccin. Si no obtiene ayuda, el estmago puede sangrar y puede desarrollar llagas (lceras) en el estmago. Cules son las causas? Esta afeccin puede ser causada por lo siguiente: Grmenes que llegan al estmago y provocan una infeccin. Beber alcohol en exceso. Los medicamentos que toma. Tener demasiado cido en el estmago. Tener una enfermedad del estmago. Algunas otras causas son las siguientes: Una reaccin alrgica. Algunos tratamientos para el cncer (radiacin). Fumar cigarrillos o usar productos que contienen nicotina o tabaco. En algunos casos, se desconoce la causa de esta afeccin. Qu incrementa el riesgo? Tener una enfermedad de los intestinos. Tener enfermedad de Crohn. Usar aspirina o ibuprofeno y otros antiinflamatorios no esteroideos (AINE) para tratar otras afecciones. Estrs. Cules son los signos o sntomas? Dolor en el estmago. Una sensacin de ardor en el estmago. Sensacin de que va a vomitar (nuseas). Vmitos o vmitos con sangre. Sensacin de estar demasiado lleno luego de comer. Prdida de peso. Mal aliento. Sangre en las heces (deposiciones). En algunos casos, no hay sntomas. Cmo se trata? Esta afeccin se trata con medicamentos. Los medicamentos que se usan dependen de lo que provoc la afeccin. Es posible que le administren lo siguiente: Antibiticos, si la causa de la afeccin fue una infeccin provocada por grmenes. Bloqueadores H2 y medicamentos similares, si la afeccin fue causada por una gran cantidad de cido en el estmago. El tratamiento tambin puede incluir interrumpir el uso de ciertos medicamentos,  como aspirina o ibuprofeno. Siga estas instrucciones en su casa: Medicamentos Use los medicamentos de venta libre y los recetados solamente como se lo haya indicado el mdico. Si le recetaron un antibitico, tmelo como se lo haya indicado el mdico. No deje de tomarlo aunque comience a sentirse mejor. Consumo de alcohol No beba alcohol si: El mdico le indica que no lo haga. Est embarazada, puede estar embarazada o est tratando de quedar embarazada. Si bebe alcohol: Limite su uso a las siguientes medidas: De 0 a 1 medida por da para las mujeres. De 0 a 2 medidas por da para los hombres. Sepa cunta cantidad de alcohol hay en las bebidas que toma. En [place], una medida equivale a una botella de cerveza de 12 oz (355 ml), un vaso de vino de 5 oz (148 ml) o un vaso de una bebida alcohlica de alta graduacin de 1 oz (44 ml). Instrucciones generales  Haga comidas pequeas y frecuentes en lugar de comidas abundantes. Evite los alimentos y las bebidas que lo hacen sentir peor. Beba suficiente lquido para mantener el pis (la orina) de color amarillo plido. Converse con el mdico sobre maneras de controlar el estrs. Puede realizar ejercicios de respiracin profunda, meditacin o yoga. No fume ni consuma ningn producto que contenga nicotina o tabaco. Si necesita ayuda para dejar de fumar, consulte al mdico. Concurra a todas las visitas de seguimiento. Comunquese con un mdico si: Sus sntomas empeoran. El dolor de estmago empeora. Los sntomas desaparecen y luego reaparecen. Tiene fiebre. Solicite ayuda de inmediato si: Vomita sangre o una sustancia parecida a los granos de caf. La materia fecal es negra o de color rojo oscuro. Vomita cada vez que intenta tomar lquidos. Estos sntomas pueden indicar una emergencia. Solicite ayuda de inmediato.   Comunquese con el servicio de emergencias de su localidad (911 en los [place]). No espere a ver si los sntomas  desaparecen. No conduzca por sus propios medios hasta el hospital. Resumen La gastritis es irritacin e hinchazn (inflamacin) del estmago. Debe obtener ayuda para tratar esta afeccin. Si no obtiene ayuda, el estmago puede sangrar y puede desarrollar llagas (lceras) en el estmago. Puede recibir tratamiento con medicamentos para los grmenes o medicamentos para bloquear la presencia de demasiada cantidad de cido en el estmago. Esta informacin no tiene como fin reemplazar el consejo del mdico. Asegrese de hacerle al mdico cualquier pregunta que tenga. Document Revised: 09/02/2020 Document Reviewed: 09/02/2020 Elsevier Patient Education  2023 Elsevier Inc.  

## 2022-06-29 NOTE — Assessment & Plan Note (Signed)
Chronic stable condition. Diet and nutrition discussed. Continue atorvastatin 20 mg daily.

## 2022-06-29 NOTE — Assessment & Plan Note (Signed)
Transitory episode of acute gastritis related to heavy spicy meal prior to symptoms.  Asymptomatic today.  Fully recovered. Benign examination.  No concerns today. Diet and nutrition discussed.

## 2022-06-29 NOTE — Progress Notes (Signed)
Jacob Kelley 57 y.o.   Chief Complaint  Patient presents with   Abdominal Pain    Patient states he experience abd pain after eating, patient states he may of had food poison     HISTORY OF PRESENT ILLNESS: This is a 57 y.o. male developed upper abdominal pain 2 days ago after heavy meal with hot spices for dinner Pain started about 2 hours after eating and discomfort lasted couple hours.  No vomiting or diarrhea Asymptomatic today.  Back to normal. Denies melena or rectal bleeding.  Abdominal Pain Pertinent negatives include no dysuria, fever, headaches or hematuria.     Prior to Admission medications   Medication Sig Start Date End Date Taking? Authorizing Provider  losartan-hydrochlorothiazide (HYZAAR) 100-12.5 MG tablet Take 1 tablet by mouth daily. 03/13/22  Yes Estelene Carmack, Ines Bloomer, MD  atorvastatin (LIPITOR) 20 MG tablet Take 1 tablet (20 mg total) by mouth daily. Patient not taking: Reported on 06/29/2022 03/14/22   Horald Pollen, MD    Allergies  Allergen Reactions   Ibuprofen Swelling and Other (See Comments)    Reaction:  Facial/eye swelling     Patient Active Problem List   Diagnosis Date Noted   Essential hypertension 03/13/2022    Past Medical History:  Diagnosis Date   Allergy    Hypertension     No past surgical history on file.  Social History   Socioeconomic History   Marital status: Married    Spouse name: Not on file   Number of children: Not on file   Years of education: Not on file   Highest education level: Not on file  Occupational History   Not on file  Tobacco Use   Smoking status: Former   Smokeless tobacco: Former  Substance and Sexual Activity   Alcohol use: Yes    Alcohol/week: 7.0 standard drinks of alcohol    Types: 7 Glasses of wine per week   Drug use: No   Sexual activity: Not on file  Other Topics Concern   Not on file  Social History Narrative   Not on file   Social Determinants of Health    Financial Resource Strain: Not on file  Food Insecurity: Not on file  Transportation Needs: Not on file  Physical Activity: Not on file  Stress: Not on file  Social Connections: Not on file  Intimate Partner Violence: Not on file    Family History  Problem Relation Age of Onset   Stroke Mother    Heart disease Father    Hypertension Brother      Review of Systems  Constitutional: Negative.  Negative for chills and fever.  HENT: Negative.  Negative for congestion and sore throat.   Respiratory: Negative.  Negative for cough and shortness of breath.   Cardiovascular: Negative.  Negative for chest pain and palpitations.  Gastrointestinal:  Positive for abdominal pain.  Genitourinary: Negative.  Negative for dysuria and hematuria.  Skin: Negative.  Negative for rash.  Neurological: Negative.  Negative for dizziness and headaches.  All other systems reviewed and are negative.   There were no vitals filed for this visit.  Physical Exam Vitals reviewed.  Constitutional:      Appearance: He is well-developed.  HENT:     Head: Normocephalic.     Mouth/Throat:     Mouth: Mucous membranes are moist.     Pharynx: Oropharynx is clear.  Eyes:     Extraocular Movements: Extraocular movements intact.  Cardiovascular:  Rate and Rhythm: Normal rate.     Pulses: Normal pulses.     Heart sounds: Normal heart sounds.  Pulmonary:     Effort: Pulmonary effort is normal.     Breath sounds: Normal breath sounds.  Abdominal:     General: There is no distension.     Palpations: Abdomen is soft.     Tenderness: There is no abdominal tenderness.  Skin:    General: Skin is warm and dry.  Neurological:     Mental Status: He is alert and oriented to person, place, and time.  Psychiatric:        Mood and Affect: Mood normal.        Behavior: Behavior normal.      ASSESSMENT & PLAN: A total of 42 minutes was spent with the patient and counseling/coordination of care regarding  preparing for this visit, review of most recent office visit notes, review of chronic medical conditions under treatment, review of all medications, diagnosis of acute gastritis and management, education on nutrition, prognosis, documentation, and need for follow-up.  Problem List Items Addressed This Visit       Cardiovascular and Mediastinum   Essential hypertension    Well-controlled hypertension. Continue Hyzaar 100-12.5 mg daily Cardiovascular risk associated with hypertension discussed Dietary approaches to stop hypertension discussed Benefits of exercise discussed      Relevant Medications   atorvastatin (LIPITOR) 20 MG tablet     Digestive   Gastritis - Primary    Transitory episode of acute gastritis related to heavy spicy meal prior to symptoms.  Asymptomatic today.  Fully recovered. Benign examination.  No concerns today. Diet and nutrition discussed.        Other   Dyslipidemia    Chronic stable condition. Diet and nutrition discussed. Continue atorvastatin 20 mg daily.      Relevant Medications   atorvastatin (LIPITOR) 20 MG tablet   Patient Instructions  Gastritis en adultos Gastritis, Adult La gastritis es irritacin e hinchazn (inflamacin) del estmago. Hay dos tipos de gastritis: Gastritis aguda. Este tipo se desarrolla rpidamente. Gastritis crnica. Este tipo es mucho ms frecuente. Se desarrolla lentamente y dura un largo tiempo. Es importante recibir ayuda para Personnel officer. Si no obtiene [place], Nurse, children's y Chief Executive Officer (lceras) en el [employer]. Cules son las causas? Esta afeccin puede ser causada por lo siguiente: Grmenes que llegan al [employer] y provocan una infeccin. Beber alcohol en exceso. Los medicamentos que toma. Tener demasiado cido [employer]. Tener una enfermedad del estmago. Algunas otras causas son las siguientes: Nurse, mental health. Algunos tratamientos para el cncer (radiacin). Fumar  cigarrillos o usar productos que contienen nicotina o tabaco. En algunos casos, se desconoce la causa de esta afeccin. Qu incrementa el riesgo? Tener una enfermedad de los intestinos. Tener enfermedad de Crohn. Usar aspirina o ibuprofeno y otros antiinflamatorios no esteroideos (AINE) para tratar otras afecciones. Estrs. Cules son los signos o sntomas? Dolor en Product manager. Una sensacin de Science writer. Sensacin de que va a vomitar (nuseas). Vmitos o vmitos con sangre. Sensacin de estar demasiado lleno luego de comer. Prdida de peso. Mal aliento. Sangre en las heces (deposiciones). En algunos casos, no hay sntomas. Cmo se trata? Esta afeccin se trata con medicamentos. Los medicamentos que se usan dependen de lo que provoc la afeccin. Es posible que le administren lo siguiente: Antibiticos, si la causa de la afeccin fue una infeccin provocada por grmenes. Bloqueadores H2 y [employer],  si la afeccin fue causada por una gran cantidad de cido en el estmago. El tratamiento tambin puede incluir interrumpir el uso de ciertos medicamentos, como aspirina o ibuprofeno. Siga estas instrucciones en su casa: Medicamentos Use los medicamentos de venta libre y los recetados solamente como se lo haya indicado el mdico. Si le recetaron un antibitico, tmelo como se lo haya indicado el mdico. No deje de tomarlo aunque comience a sentirse mejor. Consumo de alcohol No beba alcohol si: El mdico le indica que no lo haga. Est embarazada, puede estar embarazada o est tratando de [place]. Si bebe alcohol: Limite su uso a las siguientes medidas: De 0 a 1 medida por da para las mujeres. De 0 a 2 medidas por da para los hombres. Sepa cunta cantidad de alcohol hay en las bebidas que toma. En los [place], una medida equivale a una botella de cerveza de 12 oz (355 ml), un vaso de vino de 5 oz (148 ml) o un vaso de una bebida alcohlica de  alta graduacin de 1 oz (44 ml). Instrucciones generales  Haga comidas pequeas y frecuentes en lugar de comidas abundantes. Evite los alimentos y las bebidas que lo Conservation officer, nature. Beba suficiente lquido para Contractor pis (la orina) de color amarillo plido. Converse con el mdico sobre maneras de Scientific laboratory technician. Puede realizar ejercicios de respiracin profunda, meditacin o yoga. No fume ni consuma ningn producto que contenga nicotina o tabaco. Si necesita ayuda para dejar de fumar, consulte al mdico. Concurra a todas las visitas de seguimiento. Comunquese con un mdico si: Sus sntomas empeoran. El dolor de [place]. Los sntomas desaparecen y Teacher, adult education. Tiene fiebre. Solicite ayuda de inmediato si: Vomita sangre o una sustancia parecida a los granos de [place]. La materia fecal es negra o de color rojo oscuro. Vomita cada vez que intenta tomar lquidos. Estos sntomas pueden Sales executive. Solicite ayuda de inmediato. Comunquese con el servicio de emergencias de su localidad (911 en los [place]). No espere a ver si los sntomas desaparecen. No conduzca por sus propios medios [employer] hospital. Resumen La gastritis es irritacin e hinchazn (inflamacin) del estmago. Debe obtener ayuda para tratar esta afeccin. Si no obtiene [place], Nurse, children's y Chief Executive Officer (lceras) en el [employer]. Puede recibir tratamiento con medicamentos para los grmenes o medicamentos para bloquear la presencia de demasiada cantidad de cido Nurse, adult. Esta informacin no tiene Marine scientist el consejo del mdico. Asegrese de hacerle al mdico cualquier pregunta que tenga. Document Revised: 09/02/2020 Document Reviewed: 09/02/2020 Elsevier Patient Education  [place], MD [facility]

## 2022-07-21 ENCOUNTER — Telehealth: Payer: Self-pay | Admitting: Emergency Medicine

## 2022-07-21 ENCOUNTER — Other Ambulatory Visit: Payer: Self-pay | Admitting: Emergency Medicine

## 2022-07-21 MED ORDER — CETIRIZINE HCL 10 MG PO TABS: 10.0000 mg | ORAL_TABLET | Freq: Every day | ORAL | 2 refills | Status: AC

## 2022-07-21 NOTE — Telephone Encounter (Signed)
Prescription for cetirizine 10 mg sent to pharmacy of record today.  Thanks.

## 2022-07-21 NOTE — Telephone Encounter (Signed)
Patient called requesting PCP to prescribe an allergy medication. He said they spoke about it at his last appointment on 06/29/22. If possible, please send to [facility] on [address]. Best call back is [phone].

## 2023-07-31 ENCOUNTER — Other Ambulatory Visit: Payer: Self-pay | Admitting: Emergency Medicine

## 2023-07-31 DIAGNOSIS — I10 Essential (primary) hypertension: Secondary | ICD-10-CM

## 2023-09-25 ENCOUNTER — Ambulatory Visit (INDEPENDENT_AMBULATORY_CARE_PROVIDER_SITE_OTHER): Admitting: Emergency Medicine

## 2023-09-25 ENCOUNTER — Encounter: Payer: Self-pay | Admitting: Emergency Medicine

## 2023-09-25 VITALS — BP 142/90 | HR 62 | Temp 98.5°F | Ht 67.0 in | Wt 237.0 lb

## 2023-09-25 DIAGNOSIS — E785 Hyperlipidemia, unspecified: Secondary | ICD-10-CM | POA: Diagnosis not present

## 2023-09-25 DIAGNOSIS — I10 Essential (primary) hypertension: Secondary | ICD-10-CM

## 2023-09-25 DIAGNOSIS — Z1211 Encounter for screening for malignant neoplasm of colon: Secondary | ICD-10-CM

## 2023-09-25 DIAGNOSIS — L03115 Cellulitis of right lower limb: Secondary | ICD-10-CM | POA: Diagnosis not present

## 2023-09-25 MED ORDER — WEGOVY 0.25 MG/0.5ML ~~LOC~~ SOAJ: 0.2500 mg | SUBCUTANEOUS | 3 refills | Status: AC

## 2023-09-25 MED ORDER — CEFADROXIL 500 MG PO CAPS: 500.0000 mg | ORAL_CAPSULE | Freq: Two times a day (BID) | ORAL | 0 refills | Status: AC

## 2023-09-25 NOTE — Patient Instructions (Signed)
 Mantenimiento de Research officer, political party, Male Adoptar un estilo de vida saludable y recibir atencin preventiva son importantes para promover la salud y Counsellor. Consulte al mdico sobre: El esquema adecuado para hacerse pruebas y exmenes peridicos. Cosas que puede hacer por su cuenta para prevenir enfermedades y [place] sano. Qu debo saber sobre la dieta, el peso y el ejercicio? Consuma una dieta saludable  Consuma una dieta que incluya muchas verduras, frutas, productos lcteos con bajo contenido de [place] y Associate Professor. No consuma muchos alimentos ricos en grasas slidas, azcares agregados o sodio. Mantenga un peso saludable El ndice de masa muscular [facility]) es una medida que puede utilizarse para identificar posibles problemas de [place]. Proporciona una estimacin de la grasa corporal basndose en el peso y la altura. Su mdico puede ayudarle a Engineer, site IMC y a Personnel officer o Pharmacologist un peso saludable. Haga ejercicio con regularidad Haga ejercicio con regularidad. Esta es una de las prcticas ms importantes que puede hacer por su salud. La [employer] de los adultos deben seguir estas pautas: Education officer, environmental, al menos, 150 minutos de actividad fsica por semana. El ejercicio debe aumentar la frecuencia cardaca y Media planner transpirar (ejercicio de intensidad moderada). Hacer ejercicios de fortalecimiento por lo [facility] por semana. Agregue esto a su plan de ejercicio de intensidad moderada. Pase menos tiempo sentado. Incluso la actividad fsica ligera puede ser beneficiosa. Controle sus niveles de colesterol y lpidos en la sangre Comience a realizarse anlisis de lpidos y Oncologist en la sangre a los 20 aos y luego reptalos cada 5 aos. Es posible que Insurance underwriter los niveles de colesterol con mayor frecuencia si: Sus niveles de lpidos y colesterol son altos. Es mayor de 40 aos. Presenta un alto riesgo de padecer enfermedades cardacas. Qu debo  saber sobre las pruebas de deteccin del cncer? Muchos tipos de cncer pueden detectarse de manera temprana y, a menudo, pueden prevenirse. Segn su historia clnica y sus antecedentes familiares, es posible que deba realizarse pruebas de deteccin del cncer en diferentes edades. Esto puede incluir pruebas de deteccin de lo siguiente: Building services engineer. Cncer de prstata. Cncer de piel. Cncer de pulmn. Qu debo saber sobre la enfermedad cardaca, la diabetes y la hipertensin arterial? Presin arterial y enfermedad cardaca La hipertensin arterial causa enfermedades cardacas y [place] el riesgo de accidente cerebrovascular. Es ms probable que esto se manifieste en las personas que tienen lecturas de presin arterial alta o tienen sobrepeso. Hable con el mdico sobre sus valores de presin arterial deseados. Hgase controlar la presin arterial: Cada 3 a 5 aos si tiene entre 18 y 71 aos. Todos los aos si es mayor de 40 aos. Si tiene entre 65 y 26 aos y es fumador o Insurance underwriter, pregntele al mdico si debe realizarse una prueba de deteccin de aneurisma artico abdominal (AAA) por nica vez. Diabetes Realcese exmenes de deteccin de la diabetes con regularidad. Este anlisis revisa el nivel de azcar en la sangre en [place]. Hgase las pruebas de deteccin: Cada tres aos despus de los 45 aos de edad si tiene un peso normal y un bajo riesgo de padecer diabetes. Con ms frecuencia y a partir de [place] edad inferior si tiene sobrepeso o un alto riesgo de padecer diabetes. Qu debo saber sobre la prevencin de infecciones? Hepatitis B Si tiene un riesgo ms alto de contraer hepatitis B, debe someterse a un examen de deteccin de este virus. Hable con el mdico para averiguar si tiene riesgo de  contraer la infeccin por hepatitis B. Hepatitis C Se recomienda un anlisis de [place] para: Todos los que nacieron entre 1945 y [phone]. Todas las personas que tengan un riesgo de haber  contrado hepatitis C. Enfermedades de transmisin sexual (ETS) Debe realizarse pruebas de deteccin de ITS todos los aos, incluidas la gonorrea y la clamidia, si: Es sexualmente activo y es menor de [address]. Es mayor de [address], y Public affairs consultant informa que corre riesgo de tener este tipo de infecciones. La actividad sexual ha cambiado desde que le hicieron la ltima prueba de deteccin y tiene un riesgo mayor de Warehouse manager clamidia o Copy. Pregntele al mdico si usted tiene riesgo. Pregntele al mdico si usted tiene un alto riesgo de Primary school teacher VIH. El mdico tambin puede recomendarle un medicamento recetado para ayudar a evitar la infeccin por el VIH. Si elige tomar medicamentos para prevenir el VIH, primero debe [employer] de deteccin del VIH. Luego debe hacerse anlisis cada 3 meses mientras est tomando los medicamentos. Siga estas indicaciones en su casa: Consumo de alcohol No beba alcohol si el mdico se lo prohbe. Si bebe alcohol: Limite la cantidad que consume de 0 a 2 bebidas por da. Sepa cunta cantidad de alcohol hay en las bebidas que toma. En los [address], una medida equivale a una botella de cerveza de 12 oz (355 ml), un vaso de vino de 5 oz (148 ml) o un vaso de una bebida alcohlica de alta graduacin de 1 oz (44 ml). Estilo de vida No consuma ningn producto que contenga nicotina o tabaco. Estos productos incluyen cigarrillos, tabaco para Theatre manager y aparatos de vapeo, como los Administrator, Civil Service. Si necesita ayuda para dejar de consumir estos productos, consulte al mdico. No consuma drogas. No comparta agujas. Solicite ayuda a su mdico si necesita apoyo o informacin para abandonar las drogas. Indicaciones generales Realcese los estudios de rutina de [address], dentales y de Wellsite geologist. Mantngase al da con las vacunas. Infrmele a su mdico si: Se siente deprimido con frecuencia. Alguna vez ha sido vctima de [place] o no se siente seguro en su  casa. Resumen Adoptar un estilo de vida saludable y recibir atencin preventiva son importantes para promover la salud y Counsellor. Siga las instrucciones del mdico acerca de una dieta saludable, el ejercicio y la realizacin de pruebas o exmenes para Hotel manager. Siga las instrucciones del mdico con respecto al control del colesterol y la presin arterial. Esta informacin no tiene Theme park manager el consejo del mdico. Asegrese de hacerle al mdico cualquier pregunta que tenga. Document Revised: 09/08/2020 Document Reviewed: 09/08/2020 Elsevier Patient Education  2024 [employer].

## 2023-09-25 NOTE — Assessment & Plan Note (Signed)
 Elevated blood pressure reading in the office today Did not take blood pressure medication today Continue losartan  HCT 100-12.5 mg daily Cardiovascular risks associated with hypertension discussed Diet and nutrition discussed Blood work done today Follow-up in 3 months

## 2023-09-25 NOTE — Assessment & Plan Note (Signed)
 Secondary to hives and result of itching and scratching Recommend to start Duricef 500 mg twice a day for 7 days Topical care measures discussed.

## 2023-09-25 NOTE — Assessment & Plan Note (Signed)
Chronic stable condition. Diet and nutrition discussed. Continue atorvastatin 20 mg daily.

## 2023-09-25 NOTE — Progress Notes (Signed)
 Greydon Betke Villarroel 58 y.o.   Chief Complaint  Patient presents with   Follow-up    Patient states he's is here because he felt an instant feeling of being drained/ weak last week. Pt want to make sure it is not his bp medication causing this. He also wants to talk about his weight. He also mentions having to go through bushes and leaves for work and has poison ivy rashes on his right leg and inner left thigh that's been there for about 3 weeks and does not look like its getting better, he did try OTC cream to try to help but seems to not be doing anything.     HISTORY OF PRESENT ILLNESS: This is a 58 y.o. male here for follow-up of chronic medical conditions including hypertension and dyslipidemia Interested in weight loss medications Had brief episode of lightheadedness and dizziness last week that lasted a couple minutes.  Did not happen again. Also had hives and worse area is in the right calf area with redness and inflammation  HPI   Prior to Admission medications   Medication Sig Start Date End Date Taking? Authorizing Provider  atorvastatin  (LIPITOR) 20 MG tablet Take 1 tablet (20 mg total) by mouth daily. 06/29/22  Yes Amaria Mundorf, Isidro Margo, MD  losartan -hydrochlorothiazide  [facility]) 100-12.5 MG tablet TAKE ONE TABLET BY MOUTH ONE TIME DAILY 07/31/23  Yes Tyran Huser, Isidro Margo, MD  Semaglutide-Weight Management (WEGOVY) 0.25 MG/0.5ML SOAJ Inject 0.25 mg into the skin once a week. Recommend to increase dose to 0.5 mg weekly after 3 to 4 weeks if side effects tolerated 09/25/23  Yes Jaspal Pultz, Isidro Margo, MD  cetirizine  (ZYRTEC ) 10 MG tablet Take 1 tablet (10 mg total) by mouth daily. Patient not taking: Reported on 09/25/2023 07/21/22   Elvira Hammersmith, MD    Allergies  Allergen Reactions   Ibuprofen Swelling and Other (See Comments)    Reaction:  Facial/eye swelling     Patient Active Problem List   Diagnosis Date Noted   Morbid obesity (HCC) 09/25/2023   Dyslipidemia  06/29/2022   Essential hypertension 03/13/2022    Past Medical History:  Diagnosis Date   Allergy    Hypertension     No past surgical history on file.  Social History   Socioeconomic History   Marital status: Married    Spouse name: Not on file   Number of children: Not on file   Years of education: Not on file   Highest education level: GED or equivalent  Occupational History   Not on file  Tobacco Use   Smoking status: Former   Smokeless tobacco: Former  Substance and Sexual Activity   Alcohol use: Yes    Alcohol/week: 7.0 standard drinks of alcohol    Types: 7 Glasses of wine per week   Drug use: No   Sexual activity: Not on file  Other Topics Concern   Not on file  Social History Narrative   Not on file   Social Drivers of Health   Financial Resource Strain: Low Risk  (09/22/2023)   Overall Financial Resource Strain (CARDIA)    Difficulty of Paying Living Expenses: Not very hard  Food Insecurity: No Food Insecurity (09/22/2023)   Hunger Vital Sign    Worried About Running Out of Food in the Last Year: Never true    Ran Out of Food in the Last Year: Never true  Transportation Needs: No Transportation Needs (09/22/2023)   PRAPARE - Transportation    Lack of Transportation (  Medical): No    Lack of Transportation (Non-Medical): No  Physical Activity: Insufficiently Active (09/22/2023)   Exercise Vital Sign    Days of Exercise per Week: 4 days    Minutes of Exercise per Session: 20 min  Stress: No Stress Concern Present (09/22/2023)   [employer] of Occupational Health - Occupational Stress Questionnaire    Feeling of Stress : Not at all  Social Connections: Moderately Integrated (09/22/2023)   Social Connection and Isolation Panel [NHANES]    Frequency of Communication with Friends and Family: More than three times a week    Frequency of Social Gatherings with Friends and Family: Twice a week    Attends Religious Services: 1 to 4 times per year    Active Member  of [employer] or Organizations: No    Attends Engineer, structural: Not on file    Marital Status: Married  Catering manager Violence: Not on file    Family History  Problem Relation Age of Onset   Stroke Mother    Heart disease Father    Hypertension Brother      Review of Systems  Constitutional: Negative.  Negative for chills and fever.  HENT: Negative.  Negative for congestion and sore throat.   Respiratory: Negative.  Negative for cough and shortness of breath.   Cardiovascular: Negative.  Negative for chest pain and palpitations.  Gastrointestinal:  Negative for abdominal pain, diarrhea, nausea and vomiting.  Genitourinary: Negative.  Negative for dysuria and hematuria.  Skin:  Positive for itching and rash.  Neurological: Negative.  Negative for dizziness and headaches.  All other systems reviewed and are negative.   Vitals:   09/25/23 1329  BP: (!) 142/90  Pulse: 62  Temp: 98.5 F (36.9 C)  SpO2: 92%    Physical Exam Vitals reviewed.  Constitutional:      Appearance: Normal appearance.  HENT:     Head: Normocephalic.     Mouth/Throat:     Mouth: Mucous membranes are moist.     Pharynx: Oropharynx is clear.  Eyes:     Extraocular Movements: Extraocular movements intact.     Conjunctiva/sclera: Conjunctivae normal.     Pupils: Pupils are equal, round, and reactive to light.  Cardiovascular:     Rate and Rhythm: Normal rate and regular rhythm.     Pulses: Normal pulses.     Heart sounds: Normal heart sounds.  Pulmonary:     Effort: Pulmonary effort is normal.     Breath sounds: Normal breath sounds.  Musculoskeletal:     Cervical back: No tenderness.  Lymphadenopathy:     Cervical: No cervical adenopathy.  Skin:    General: Skin is warm and dry.     Capillary Refill: Capillary refill takes less than 2 seconds.     Comments: Large erythematous area to right calf compatible with cellulitis  Neurological:     General: No focal deficit present.      Mental Status: He is alert and oriented to person, place, and time.  Psychiatric:        Mood and Affect: Mood normal.        Behavior: Behavior normal.      ASSESSMENT & PLAN: A total of 45 minutes was spent with the patient and counseling/coordination of care regarding preparing for this visit, review of most recent office visit notes, review of multiple chronic medical conditions and their management, review of all medications, review of most recent bloodwork results, review of health maintenance items,  education on nutrition, prognosis, documentation, and need for follow up.   Problem List Items Addressed This Visit       Cardiovascular and Mediastinum   Essential hypertension - Primary   Elevated blood pressure reading in the office today Did not take blood pressure medication today Continue losartan  HCT 100-12.5 mg daily Cardiovascular risks associated with hypertension discussed Diet and nutrition discussed Blood work done today Follow-up in 3 months      Relevant Medications   Semaglutide-Weight Management (WEGOVY) 0.25 MG/0.5ML SOAJ   Other Relevant Orders   CBC with Differential/Platelet   Comprehensive metabolic panel with GFR   Hemoglobin A1c   Lipid panel   TSH     Other   Dyslipidemia   Chronic stable condition. Diet and nutrition discussed. Continue atorvastatin  20 mg daily.      Relevant Medications   Semaglutide-Weight Management (WEGOVY) 0.25 MG/0.5ML SOAJ   Other Relevant Orders   CBC with Differential/Platelet   Comprehensive metabolic panel with GFR   Hemoglobin A1c   Lipid panel   TSH   Morbid obesity (HCC)   Cardiovascular risks associated with obesity discussed Diet and nutrition discussed Benefits of exercise discussed Will benefit from GLP-1 agonist Recommend Wegovy Blood work today follow-up in 3 months      Relevant Medications   Semaglutide-Weight Management (WEGOVY) 0.25 MG/0.5ML SOAJ   Other Relevant Orders   CBC with  Differential/Platelet   Comprehensive metabolic panel with GFR   Hemoglobin A1c   Lipid panel   TSH   Cellulitis of right lower extremity   Secondary to hives and result of itching and scratching Recommend to start Duricef 500 mg twice a day for 7 days Topical care measures discussed.      Relevant Medications   cefadroxil (DURICEF) 500 MG capsule   Other Visit Diagnoses       Screening for colon cancer       Relevant Orders   Cologuard        Patient Instructions  Mantenimiento de Radiographer, therapeutic en los hombres Health Maintenance, Male Adoptar un estilo de vida saludable y recibir atencin preventiva son importantes para promover la salud y Counsellor. Consulte al mdico sobre: El esquema adecuado para hacerse pruebas y exmenes peridicos. Cosas que puede hacer por su cuenta para prevenir enfermedades y [place] sano. Qu debo saber sobre la dieta, el peso y el ejercicio? Consuma una dieta saludable  Consuma una dieta que incluya muchas verduras, frutas, productos lcteos con bajo contenido de grasa y protenas magras. No consuma muchos alimentos ricos en grasas slidas, azcares agregados o sodio. Mantenga un peso saludable El ndice de masa muscular [facility]) es una medida que puede utilizarse para identificar posibles problemas de [place]. Proporciona una estimacin de la grasa corporal basndose en el peso y la altura. Su mdico puede ayudarle a determinar su IMC y a Personnel officer o Pharmacologist un peso saludable. Haga ejercicio con regularidad Haga ejercicio con regularidad. Esta es una de las prcticas ms importantes que puede hacer por su salud. La [employer] de los adultos deben seguir estas pautas: Education officer, environmental, al menos, 150 minutos de actividad fsica por semana. El ejercicio debe aumentar la frecuencia cardaca y Media planner transpirar (ejercicio de intensidad moderada). Hacer ejercicios de fortalecimiento por lo [facility] por semana. Agregue esto a su plan de ejercicio de intensidad  moderada. Pase menos tiempo sentado. Incluso la actividad fsica ligera puede ser beneficiosa. Controle sus niveles de colesterol y lpidos en la sangre Comience a realizarse  anlisis de lpidos y [employer] sangre a los 20 aos y luego reptalos cada 5 aos. Es posible que Insurance underwriter los niveles de colesterol con mayor frecuencia si: Sus niveles de lpidos y colesterol son altos. Es mayor de 40 aos. Presenta un alto riesgo de padecer enfermedades cardacas. Qu debo saber sobre las pruebas de deteccin del cncer? Muchos tipos de cncer pueden detectarse de manera temprana y, a menudo, pueden prevenirse. Segn su historia clnica y sus antecedentes familiares, es posible que deba realizarse pruebas de deteccin del cncer en diferentes edades. Esto puede incluir pruebas de deteccin de lo siguiente: Building services engineer. Cncer de prstata. Cncer de piel. Cncer de pulmn. Qu debo saber sobre la enfermedad cardaca, la diabetes y la hipertensin arterial? Presin arterial y enfermedad cardaca La hipertensin arterial causa enfermedades cardacas y [place] el riesgo de accidente cerebrovascular. Es ms probable que esto se manifieste en las personas que tienen lecturas de presin arterial alta o tienen sobrepeso. Hable con el mdico sobre sus valores de presin arterial deseados. Hgase controlar la presin arterial: Cada 3 a 5 aos si tiene entre 18 y 92 aos. Todos los aos si es mayor de 40 aos. Si tiene entre 65 y 34 aos y es fumador o Insurance underwriter, pregntele al mdico si debe realizarse una prueba de deteccin de aneurisma artico abdominal (AAA) por nica vez. Diabetes Realcese exmenes de deteccin de la diabetes con regularidad. Este anlisis revisa el nivel de azcar en la sangre en [place]. Hgase las pruebas de deteccin: Cada tres aos despus de los 45 aos de edad si tiene un peso normal y un bajo riesgo de padecer diabetes. Con ms frecuencia y a partir de  [place] edad inferior si tiene sobrepeso o un alto riesgo de padecer diabetes. Qu debo saber sobre la prevencin de infecciones? Hepatitis B Si tiene un riesgo ms alto de contraer hepatitis B, debe someterse a un examen de deteccin de este virus. Hable con el mdico para averiguar si tiene riesgo de contraer la infeccin por hepatitis B. Hepatitis C Se recomienda un anlisis de [place] para: Todos los que nacieron entre 1945 y [phone]. Todas las personas que tengan un riesgo de haber contrado hepatitis C. Enfermedades de transmisin sexual (ETS) Debe realizarse pruebas de deteccin de ITS todos los aos, incluidas la gonorrea y la clamidia, si: Es sexualmente activo y es menor de [address]. Es mayor de [address], y Public affairs consultant informa que corre riesgo de tener este tipo de infecciones. La actividad sexual ha cambiado desde que le hicieron la ltima prueba de deteccin y tiene un riesgo mayor de tener clamidia o Copy. Pregntele al mdico si usted tiene riesgo. Pregntele al mdico si usted tiene un alto riesgo de Primary school teacher VIH. El mdico tambin puede recomendarle un medicamento recetado para ayudar a evitar la infeccin por el VIH. Si elige tomar medicamentos para prevenir el VIH, primero debe [employer] de deteccin del VIH. Luego debe hacerse anlisis cada 3 meses mientras est tomando los medicamentos. Siga estas indicaciones en su casa: Consumo de alcohol No beba alcohol si el mdico se lo prohbe. Si bebe alcohol: Limite la cantidad que consume de 0 a 2 bebidas por da. Sepa cunta cantidad de alcohol hay en las bebidas que toma. En los [address], una medida equivale a una botella de cerveza de 12 oz (355 ml), un vaso de vino de 5 oz (148 ml) o un vaso de una bebida alcohlica de alta graduacin  de 1 oz (44 ml). Estilo de vida No consuma ningn producto que contenga nicotina o tabaco. Estos productos incluyen cigarrillos, tabaco para Theatre manager y aparatos de vapeo, como los  cigarrillos electrnicos. Si necesita ayuda para dejar de consumir estos productos, consulte al mdico. No consuma drogas. No comparta agujas. Solicite ayuda a su mdico si necesita apoyo o informacin para abandonar las drogas. Indicaciones generales Realcese los estudios de rutina de [address], dentales y de Wellsite geologist. Mantngase al da con las vacunas. Infrmele a su mdico si: Se siente deprimido con frecuencia. Alguna vez ha sido vctima de maltrato o no se siente seguro en su casa. Resumen Adoptar un estilo de vida saludable y recibir atencin preventiva son importantes para promover la salud y Counsellor. Siga las instrucciones del mdico acerca de una dieta saludable, el ejercicio y la realizacin de pruebas o exmenes para Hotel manager. Siga las instrucciones del mdico con respecto al control del colesterol y la presin arterial. Esta informacin no tiene Theme park manager el consejo del mdico. Asegrese de hacerle al mdico cualquier pregunta que tenga. Document Revised: 09/08/2020 Document Reviewed: 09/08/2020 Elsevier Patient Education  2024 Elsevier Inc.     Maryagnes Small, MD [facility]

## 2023-09-25 NOTE — Assessment & Plan Note (Signed)
 Cardiovascular risks associated with obesity discussed Diet and nutrition discussed Benefits of exercise discussed Will benefit from GLP-1 agonist Recommend Wegovy Blood work today follow-up in 3 months

## 2023-09-29 ENCOUNTER — Ambulatory Visit: Payer: Self-pay | Admitting: Emergency Medicine

## 2023-11-15 ENCOUNTER — Other Ambulatory Visit: Payer: Self-pay | Admitting: Emergency Medicine

## 2023-11-15 DIAGNOSIS — I10 Essential (primary) hypertension: Secondary | ICD-10-CM

## 2023-12-26 ENCOUNTER — Ambulatory Visit: Admitting: Emergency Medicine

## 2023-12-26 ENCOUNTER — Encounter: Payer: Self-pay | Admitting: Emergency Medicine
